# Patient Record
Sex: Female | Born: 1990 | Race: White | Hispanic: No | Marital: Married | Smoking: Current every day smoker
Health system: Southern US, Community
[De-identification: ages and names within clinical notes are randomized; demographics above are authoritative.]

## PROBLEM LIST (undated history)

## (undated) DIAGNOSIS — R87810 Cervical high risk human papillomavirus (HPV) DNA test positive: Secondary | ICD-10-CM

## (undated) DIAGNOSIS — J45909 Unspecified asthma, uncomplicated: Secondary | ICD-10-CM

## (undated) DIAGNOSIS — Z789 Other specified health status: Secondary | ICD-10-CM

## (undated) DIAGNOSIS — R87610 Atypical squamous cells of undetermined significance on cytologic smear of cervix (ASC-US): Secondary | ICD-10-CM

## (undated) HISTORY — DX: Atypical squamous cells of undetermined significance on cytologic smear of cervix (ASC-US): R87.610

## (undated) HISTORY — PX: SHOULDER SURGERY: SHX246

## (undated) HISTORY — DX: Cervical high risk human papillomavirus (HPV) DNA test positive: R87.810

---

## 2009-05-05 ENCOUNTER — Emergency Department: Payer: Self-pay | Admitting: Internal Medicine

## 2010-07-01 ENCOUNTER — Ambulatory Visit: Payer: Self-pay | Admitting: Family Medicine

## 2011-01-17 ENCOUNTER — Ambulatory Visit: Payer: Self-pay | Admitting: Internal Medicine

## 2011-01-19 ENCOUNTER — Ambulatory Visit: Payer: Self-pay | Admitting: Internal Medicine

## 2011-03-19 HISTORY — PX: SHOULDER SURGERY: SHX246

## 2011-04-03 ENCOUNTER — Emergency Department (HOSPITAL_COMMUNITY): Payer: 59

## 2011-04-03 ENCOUNTER — Encounter (HOSPITAL_COMMUNITY): Payer: Self-pay

## 2011-04-03 ENCOUNTER — Encounter (HOSPITAL_COMMUNITY): Payer: Self-pay | Admitting: *Deleted

## 2011-04-03 ENCOUNTER — Observation Stay (HOSPITAL_COMMUNITY)
Admission: EM | Admit: 2011-04-03 | Discharge: 2011-04-04 | Disposition: A | Discharge status: Home / Self Care | Payer: 59 | Attending: Orthopaedic Surgery | Admitting: Orthopaedic Surgery

## 2011-04-03 ENCOUNTER — Encounter (HOSPITAL_COMMUNITY)
Admission: EM | Disposition: A | Discharge status: Home / Self Care | Payer: Self-pay | Source: Home / Self Care | Attending: Emergency Medicine

## 2011-04-03 ENCOUNTER — Encounter: Payer: Self-pay | Admitting: Emergency Medicine

## 2011-04-03 DIAGNOSIS — R51 Headache: Secondary | ICD-10-CM | POA: Insufficient documentation

## 2011-04-03 DIAGNOSIS — S42209A Unspecified fracture of upper end of unspecified humerus, initial encounter for closed fracture: Secondary | ICD-10-CM

## 2011-04-03 DIAGNOSIS — W19XXXA Unspecified fall, initial encounter: Secondary | ICD-10-CM | POA: Insufficient documentation

## 2011-04-03 DIAGNOSIS — Z23 Encounter for immunization: Secondary | ICD-10-CM | POA: Insufficient documentation

## 2011-04-03 DIAGNOSIS — S42213A Unspecified displaced fracture of surgical neck of unspecified humerus, initial encounter for closed fracture: Secondary | ICD-10-CM | POA: Insufficient documentation

## 2011-04-03 DIAGNOSIS — M25519 Pain in unspecified shoulder: Principal | ICD-10-CM | POA: Insufficient documentation

## 2011-04-03 DIAGNOSIS — Y92009 Unspecified place in unspecified non-institutional (private) residence as the place of occurrence of the external cause: Secondary | ICD-10-CM | POA: Insufficient documentation

## 2011-04-03 HISTORY — PX: ORIF HUMERUS FRACTURE: SHX2126

## 2011-04-03 LAB — CBC
HCT: 37.4 % (ref 36.0–46.0)
Hemoglobin: 12.6 g/dL (ref 12.0–15.0)
MCV: 88.6 fL (ref 78.0–100.0)
RBC: 4.22 MIL/uL (ref 3.87–5.11)
RDW: 12.3 % (ref 11.5–15.5)
WBC: 18.9 10*3/uL — ABNORMAL HIGH (ref 4.0–10.5)

## 2011-04-03 LAB — BASIC METABOLIC PANEL
BUN: 8 mg/dL (ref 6–23)
CO2: 23 mEq/L (ref 19–32)
Chloride: 104 mEq/L (ref 96–112)
GFR calc Af Amer: >90 mL/min (ref >90)
Potassium: 3.6 mEq/L (ref 3.5–5.1)

## 2011-04-03 SURGERY — OPEN REDUCTION INTERNAL FIXATION (ORIF) PROXIMAL HUMERUS FRACTURE
Anesthesia: General | Site: Shoulder | Laterality: Right | Wound class: Clean

## 2011-04-03 MED ORDER — MORPHINE SULFATE 2 MG/ML IJ SOLN: 1.0000 mg | INTRAMUSCULAR | Status: DC | PRN

## 2011-04-03 MED ORDER — ONDANSETRON HCL 4 MG/2ML IJ SOLN
INTRAMUSCULAR | Status: DC | PRN
  Administered 2011-04-03: 4 mg via INTRAVENOUS

## 2011-04-03 MED ORDER — METHOCARBAMOL 100 MG/ML IJ SOLN: 500.0000 mg | Freq: Four times a day (QID) | INTRAVENOUS | Status: DC | PRN

## 2011-04-03 MED ORDER — ONDANSETRON HCL 4 MG/2ML IJ SOLN: 4.0000 mg | Freq: Four times a day (QID) | INTRAMUSCULAR | Status: DC | PRN

## 2011-04-03 MED ORDER — DEXAMETHASONE SODIUM PHOSPHATE 10 MG/ML IJ SOLN
INTRAMUSCULAR | Status: DC | PRN
  Administered 2011-04-03: 10 mg via INTRAVENOUS

## 2011-04-03 MED ORDER — HYDROMORPHONE HCL PF 1 MG/ML IJ SOLN
1.0000 mg | Freq: Once | INTRAMUSCULAR | Status: AC
  Administered 2011-04-03: 1 mg via INTRAVENOUS
  Filled 2011-04-03: qty 1

## 2011-04-03 MED ORDER — INFLUENZA VIRUS VACC SPLIT PF IM SUSP
0.5000 mL | INTRAMUSCULAR | Status: DC
  Filled 2011-04-03: qty 0.5

## 2011-04-03 MED ORDER — LIDOCAINE HCL (PF) 1 % IJ SOLN
INTRAMUSCULAR | Status: DC | PRN
  Administered 2011-04-03: 1 mL

## 2011-04-03 MED ORDER — PROPOFOL 10 MG/ML IV EMUL
INTRAVENOUS | Status: DC | PRN
  Administered 2011-04-03: 200 mg via INTRAVENOUS

## 2011-04-03 MED ORDER — TETANUS-DIPHTH-ACELL PERTUSSIS 5-2.5-18.5 LF-MCG/0.5 IM SUSP
0.5000 mL | Freq: Once | INTRAMUSCULAR | Status: AC
  Administered 2011-04-03: 0.5 mL via INTRAMUSCULAR
  Filled 2011-04-03: qty 0.5

## 2011-04-03 MED ORDER — 0.9 % SODIUM CHLORIDE (POUR BTL) OPTIME
TOPICAL | Status: DC | PRN
  Administered 2011-04-03: 1000 mL

## 2011-04-03 MED ORDER — FENTANYL CITRATE 0.05 MG/ML IJ SOLN
INTRAMUSCULAR | Status: DC | PRN
  Administered 2011-04-03 (×2): 50 ug via INTRAVENOUS
  Administered 2011-04-03: 25 ug via INTRAVENOUS

## 2011-04-03 MED ORDER — PHENYLEPHRINE HCL 10 MG/ML IJ SOLN
INTRAMUSCULAR | Status: DC | PRN
  Administered 2011-04-03: 80 ug via INTRAVENOUS

## 2011-04-03 MED ORDER — MORPHINE SULFATE 2 MG/ML IJ SOLN: 0.0500 mg/kg | INTRAMUSCULAR | Status: DC | PRN

## 2011-04-03 MED ORDER — BUPIVACAINE HCL (PF) 0.5 % IJ SOLN
INTRAMUSCULAR | Status: DC | PRN
  Administered 2011-04-03: 25 mL

## 2011-04-03 MED ORDER — METOCLOPRAMIDE HCL 5 MG/ML IJ SOLN
5.0000 mg | Freq: Three times a day (TID) | INTRAMUSCULAR | Status: DC | PRN
  Filled 2011-04-03: qty 2

## 2011-04-03 MED ORDER — METOCLOPRAMIDE HCL 10 MG PO TABS
5.0000 mg | ORAL_TABLET | Freq: Three times a day (TID) | ORAL | Status: DC | PRN
  Administered 2011-04-04: 10 mg via ORAL
  Filled 2011-04-03: qty 1

## 2011-04-03 MED ORDER — NICOTINE 21 MG/24HR TD PT24
21.0000 mg | MEDICATED_PATCH | Freq: Every day | TRANSDERMAL | Status: DC
  Administered 2011-04-03: 21 mg via TRANSDERMAL
  Filled 2011-04-03 (×2): qty 1

## 2011-04-03 MED ORDER — MIDAZOLAM HCL 5 MG/5ML IJ SOLN
INTRAMUSCULAR | Status: DC | PRN
  Administered 2011-04-03: 2 mg via INTRAVENOUS

## 2011-04-03 MED ORDER — SODIUM CHLORIDE 0.9 % IV SOLN
INTRAVENOUS | Status: DC
  Administered 2011-04-03 – 2011-04-04 (×2): via INTRAVENOUS

## 2011-04-03 MED ORDER — GLYCOPYRROLATE 0.2 MG/ML IJ SOLN
INTRAMUSCULAR | Status: DC | PRN
  Administered 2011-04-03: .4 mg via INTRAVENOUS

## 2011-04-03 MED ORDER — ROCURONIUM BROMIDE 100 MG/10ML IV SOLN
INTRAVENOUS | Status: DC | PRN
  Administered 2011-04-03: 30 mg via INTRAVENOUS

## 2011-04-03 MED ORDER — FENTANYL CITRATE 0.05 MG/ML IJ SOLN: 25.0000 ug | INTRAMUSCULAR | Status: DC | PRN

## 2011-04-03 MED ORDER — METOCLOPRAMIDE HCL 5 MG/ML IJ SOLN: 10.0000 mg | Freq: Once | INTRAMUSCULAR | Status: DC | PRN

## 2011-04-03 MED ORDER — SUCCINYLCHOLINE CHLORIDE 20 MG/ML IJ SOLN
INTRAMUSCULAR | Status: DC | PRN
  Administered 2011-04-03: 100 mg via INTRAVENOUS

## 2011-04-03 MED ORDER — OXYCODONE HCL 5 MG PO TABS
5.0000 mg | ORAL_TABLET | ORAL | Status: DC | PRN
  Administered 2011-04-03: 10 mg via ORAL
  Filled 2011-04-03: qty 2

## 2011-04-03 MED ORDER — CEFAZOLIN SODIUM 1-5 GM-% IV SOLN
1.0000 g | Freq: Four times a day (QID) | INTRAVENOUS | Status: AC
  Administered 2011-04-03 – 2011-04-04 (×3): 1 g via INTRAVENOUS
  Filled 2011-04-03 (×3): qty 50

## 2011-04-03 MED ORDER — DIPHENHYDRAMINE HCL 12.5 MG/5ML PO ELIX
12.5000 mg | ORAL_SOLUTION | ORAL | Status: DC | PRN
  Filled 2011-04-03: qty 10

## 2011-04-03 MED ORDER — CEFAZOLIN SODIUM 1-5 GM-% IV SOLN
INTRAVENOUS | Status: DC | PRN
  Administered 2011-04-03: 1 g via INTRAVENOUS

## 2011-04-03 MED ORDER — LACTATED RINGERS IV SOLN
INTRAVENOUS | Status: DC | PRN
  Administered 2011-04-03 (×2): via INTRAVENOUS

## 2011-04-03 MED ORDER — ONDANSETRON HCL 4 MG PO TABS: 4.0000 mg | ORAL_TABLET | Freq: Four times a day (QID) | ORAL | Status: DC | PRN

## 2011-04-03 MED ORDER — HYDROCODONE-ACETAMINOPHEN 5-325 MG PO TABS
1.0000 | ORAL_TABLET | ORAL | Status: DC | PRN
  Administered 2011-04-04: 1 via ORAL
  Filled 2011-04-03: qty 1

## 2011-04-03 MED ORDER — HYDROMORPHONE HCL PF 1 MG/ML IJ SOLN
1.0000 mg | INTRAMUSCULAR | Status: DC | PRN
  Administered 2011-04-03: 1 mg via INTRAVENOUS
  Filled 2011-04-03: qty 1

## 2011-04-03 MED ORDER — NEOSTIGMINE METHYLSULFATE 1 MG/ML IJ SOLN
INTRAMUSCULAR | Status: DC | PRN
  Administered 2011-04-03: 3 mg via INTRAVENOUS

## 2011-04-03 MED ORDER — DROPERIDOL 2.5 MG/ML IJ SOLN
INTRAMUSCULAR | Status: DC | PRN
  Administered 2011-04-03: 0.625 mg via INTRAVENOUS

## 2011-04-03 MED ORDER — METHOCARBAMOL 500 MG PO TABS
500.0000 mg | ORAL_TABLET | Freq: Four times a day (QID) | ORAL | Status: DC | PRN
  Administered 2011-04-03 – 2011-04-04 (×2): 500 mg via ORAL
  Filled 2011-04-03 (×2): qty 1

## 2011-04-03 MED ORDER — ONDANSETRON HCL 4 MG/2ML IJ SOLN
4.0000 mg | Freq: Once | INTRAMUSCULAR | Status: AC
  Administered 2011-04-03: 4 mg via INTRAVENOUS
  Filled 2011-04-03: qty 2

## 2011-04-03 MED ORDER — ZOLPIDEM TARTRATE 5 MG PO TABS: 5.0000 mg | ORAL_TABLET | Freq: Every evening | ORAL | Status: DC | PRN

## 2011-04-03 SURGICAL SUPPLY — 53 items
BIT DRILL 4 LONG FAST STEP (BIT) ×2 IMPLANT
BIT DRILL 4 SHORT FAST STEP (BIT) ×2 IMPLANT
CLOSURE STERI STRIP 1/2 X4 (GAUZE/BANDAGES/DRESSINGS) ×2 IMPLANT
CLOTH BEACON ORANGE TIMEOUT ST (SAFETY) ×2 IMPLANT
DRAPE C-ARM 42X72 X-RAY (DRAPES) ×2 IMPLANT
DRAPE INCISE IOBAN 66X45 STRL (DRAPES) ×4 IMPLANT
DRAPE U-SHAPE 47X51 STRL (DRAPES) ×2 IMPLANT
DRILL BIT 2.8MM ×2 IMPLANT
DRSG PAD ABDOMINAL 8X10 ST (GAUZE/BANDAGES/DRESSINGS) ×4 IMPLANT
ELECT REM PT RETURN 9FT ADLT (ELECTROSURGICAL) ×2
ELECTRODE REM PT RTRN 9FT ADLT (ELECTROSURGICAL) ×1 IMPLANT
GAUZE XEROFORM 1X8 LF (GAUZE/BANDAGES/DRESSINGS) ×2 IMPLANT
GLOVE BIOGEL PI IND STRL 8 (GLOVE) ×2 IMPLANT
GLOVE BIOGEL PI INDICATOR 8 (GLOVE) ×2
GLOVE ORTHO TXT STRL SZ7.5 (GLOVE) ×2 IMPLANT
GOWN PREVENTION PLUS LG XLONG (DISPOSABLE) IMPLANT
GOWN PREVENTION PLUS XLARGE (GOWN DISPOSABLE) ×2 IMPLANT
GOWN STRL NON-REIN LRG LVL3 (GOWN DISPOSABLE) ×2 IMPLANT
KIT BASIN OR (CUSTOM PROCEDURE TRAY) ×2 IMPLANT
KIT ROOM TURNOVER OR (KITS) ×2 IMPLANT
MANIFOLD NEPTUNE II (INSTRUMENTS) ×2 IMPLANT
NEEDLE 22X1 1/2 (OR ONLY) (NEEDLE) IMPLANT
NS IRRIG 1000ML POUR BTL (IV SOLUTION) ×2 IMPLANT
PACK SHOULDER (CUSTOM PROCEDURE TRAY) ×2 IMPLANT
PAD ARMBOARD 7.5X6 YLW CONV (MISCELLANEOUS) ×4 IMPLANT
PEG STND 4.0X25.0MM (Orthopedic Implant) ×2 IMPLANT
PEG STND 4.0X30MM (Orthopedic Implant) ×2 IMPLANT
PEG STND 4.0X32.5MM (Orthopedic Implant) IMPLANT
PEG STND 4.0X37.5MM (Orthopedic Implant) ×4 IMPLANT
PEG STND 4.0X42.5MM (Orthopedic Implant) ×2 IMPLANT
PEGSTD 4.0X30MM (Orthopedic Implant) ×1 IMPLANT
PEGSTD 4.0X32.5MM (Orthopedic Implant) IMPLANT
PEGSTD 4.0X37.5MM (Orthopedic Implant) ×2 IMPLANT
PEGSTD 4.0X42.5MM (Orthopedic Implant) ×1 IMPLANT
PLATE SHOULDER S3 3HOLE RT (Plate) ×2 IMPLANT
SCREW LOCK 90D ANGLED 3.8X26 (Screw) ×2 IMPLANT
SCREW LOCK 90D ANGLED 3.8X28 (Screw) ×2 IMPLANT
SCREW LOCK 90D ANGLED 3.8X30 (Screw) ×2 IMPLANT
SPONGE GAUZE 4X4 12PLY (GAUZE/BANDAGES/DRESSINGS) ×2 IMPLANT
SPONGE LAP 4X18 X RAY DECT (DISPOSABLE) ×4 IMPLANT
STAPLER VISISTAT 35W (STAPLE) ×2 IMPLANT
STRIP CLOSURE SKIN 1/2X4 (GAUZE/BANDAGES/DRESSINGS) ×2 IMPLANT
SUCTION FRAZIER TIP 10 FR DISP (SUCTIONS) ×2 IMPLANT
SUT MNCRL AB 4-0 PS2 18 (SUTURE) ×2 IMPLANT
SUT VIC AB 0 CT1 27 (SUTURE) ×1
SUT VIC AB 0 CT1 27XBRD ANBCTR (SUTURE) ×1 IMPLANT
SUT VIC AB 2-0 CT1 27 (SUTURE) ×2
SUT VIC AB 2-0 CT1 TAPERPNT 27 (SUTURE) ×2 IMPLANT
SYR CONTROL 10ML LL (SYRINGE) IMPLANT
TOWEL OR 17X24 6PK STRL BLUE (TOWEL DISPOSABLE) ×2 IMPLANT
TOWEL OR 17X26 10 PK STRL BLUE (TOWEL DISPOSABLE) ×2 IMPLANT
WATER STERILE IRR 1000ML POUR (IV SOLUTION) IMPLANT
YANKAUER SUCT BULB TIP NO VENT (SUCTIONS) IMPLANT

## 2011-04-03 NOTE — Brief Op Note (Signed)
04/03/2011  11:18 AM  PATIENT:  Kendra Lowe  20 y.o. female  PRE-OPERATIVE DIAGNOSIS:  right proximal humerus fracture  POST-OPERATIVE DIAGNOSIS:  Right Proximal Humerus Fracture  PROCEDURE:  Procedure(s): OPEN REDUCTION INTERNAL FIXATION (ORIF) PROXIMAL HUMERUS FRACTURE  SURGEON:  Surgeon(s): Kathryne Hitch  PHYSICIAN ASSISTANT:   ASSISTANTS: none   ANESTHESIA:   regional and general  EBL:  Total I/O In: 1000 [I.V.:1000] Out: 100 [Blood:100]  BLOOD ADMINISTERED:none  DRAINS: none   LOCAL MEDICATIONS USED:  NONE  SPECIMEN:  No Specimen  DISPOSITION OF SPECIMEN:  N/A  COUNTS:  YES  TOURNIQUET:  * No tourniquets in log *  DICTATION: .Other Dictation: Dictation Number [id number]  PLAN OF CARE: Admit for overnight observation  PATIENT DISPOSITION:  PACU - hemodynamically stable.   Delay start of Pharmacological VTE agent (>24hrs) due to surgical blood loss or risk of bleeding:  {YES/NO/NOT APPLICABLE:20182

## 2011-04-03 NOTE — Anesthesia Procedure Notes (Addendum)
Anesthesia Regional Block:  Interscalene brachial plexus block  Pre-Anesthetic Checklist: ,, timeout performed, Correct Patient, Correct Site, Correct Laterality, Correct Procedure, Correct Position, site marked, Risks and benefits discussed,  Surgical consent,  Pre-op evaluation,  At surgeon's request and post-op pain management  Laterality: Right  Prep: chloraprep       Needles:   Needle Type: Other   (Arrow Echogenic)   Needle Length: 9cm  Needle Gauge: 21    Additional Needles:  Procedures: ultrasound guided Interscalene brachial plexus block Narrative:  Start time: 04/03/2011 8:53 AM End time: 04/03/2011 9:00 AM Injection made incrementally with aspirations every 5 mL.  Performed by: Personally  Anesthesiologist: Aldona Lento, MD  Additional Notes: Ultrasound guidance used to: id relevant anatomy, confirm needle position, local anesthetic spread, avoidance of vascular puncture. Picture saved. No complications. Block performed personally by Janetta Hora. Gelene Mink, MD    Interscalene brachial plexus block Procedure Name: Intubation Date/Time: 04/03/2011 9:25 AM Performed by: Fuller [place] Pre-anesthesia Checklist: Patient identified, Emergency Drugs available, Suction available, Patient being monitored and Timeout performed Patient Re-evaluated:Patient Re-evaluated prior to inductionOxygen Delivery Method: Circle System Utilized Preoxygenation: Pre-oxygenation with 100% oxygen Intubation Type: IV induction and Rapid sequence Laryngoscope Size: Mac and 3 Grade View: Grade I Tube type: Oral Number of attempts: 1 Airway Equipment and Method: stylet Placement Confirmation: ETT inserted through vocal cords under direct vision,  positive ETCO2 and breath sounds checked- equal and bilateral Secured at: 21 cm Tube secured with: Tape Dental Injury: Teeth and Oropharynx as per pre-operative assessment

## 2011-04-03 NOTE — Transfer of Care (Signed)
Immediate Anesthesia Transfer of Care Note  Patient: Oncologist  Procedure(s) Performed:  OPEN REDUCTION INTERNAL FIXATION (ORIF) PROXIMAL HUMERUS FRACTURE  Patient Location: PACU  Anesthesia Type: General  Level of Consciousness: awake, oriented and patient cooperative  Airway & Oxygen Therapy: Patient Spontanous Breathing and Patient connected to nasal cannula oxygen  Post-op Assessment: Report given to PACU RN and Post -op Vital signs reviewed and stable  Post vital signs: Reviewed and stable  Complications: No apparent anesthesia complications

## 2011-04-03 NOTE — ED Notes (Signed)
Pt was trying to go find her dog when a cow knocked her down when she got up the cow hit her in the head and knocked her forward, dried blood to both nares and pt in full spinal, right upper arm pain and deformity,

## 2011-04-03 NOTE — Anesthesia Preprocedure Evaluation (Addendum)
Anesthesia Evaluation  Patient identified by MRN, date of birth, ID band Patient awake    Reviewed: Allergy & Precautions, H&P , NPO status , Patient's Chart, lab work & pertinent test results  History of Anesthesia Complications Negative for: history of anesthetic complications  Airway Mallampati: II TM Distance: >3 FB Neck ROM: Full    Dental  (+) Teeth Intact   Pulmonary neg pulmonary ROS,          Cardiovascular neg cardio ROS Regular Normal    Neuro/Psych Negative Neurological ROS  Negative Psych ROS   GI/Hepatic negative GI ROS, Neg liver ROS,   Endo/Other  Negative Endocrine ROS  Renal/GU negative Renal ROS  Genitourinary negative   Musculoskeletal negative musculoskeletal ROS (+)   Abdominal   Peds negative pediatric ROS (+)  Hematology negative hematology ROS (+)   Anesthesia Other Findings   Reproductive/Obstetrics negative OB ROS                           Anesthesia Physical Anesthesia Plan  ASA: I  Anesthesia Plan: General   Post-op Pain Management: MAC Combined w/ Regional for Post-op pain   Induction: Intravenous  Airway Management Planned: Oral ETT  Additional Equipment:   Intra-op Plan:   Post-operative Plan: Extubation in OR  Informed Consent: I have reviewed the patients History and Physical, chart, labs and discussed the procedure including the risks, benefits and alternatives for the proposed anesthesia with the patient or authorized representative who has indicated his/her understanding and acceptance.   Dental advisory given  Plan Discussed with: CRNA, Surgeon and Anesthesiologist  Anesthesia Plan Comments:        Anesthesia Quick Evaluation

## 2011-04-03 NOTE — Anesthesia Postprocedure Evaluation (Signed)
  Anesthesia Post Note  Patient: Oncologist  Procedure(s) Performed:  OPEN REDUCTION INTERNAL FIXATION (ORIF) PROXIMAL HUMERUS FRACTURE  Anesthesia type: General  Patient location: PACU  Post pain: Pain level controlled  Post assessment: Patient's Cardiovascular Status Stable  Last Vitals:  Filed Vitals:   04/03/11 1148  BP:   Pulse: 66  Temp:   Resp: 15    Post vital signs: Reviewed and stable  Level of consciousness: sedated  Complications: No apparent anesthesia complications

## 2011-04-03 NOTE — ED Notes (Signed)
Pt transported to or

## 2011-04-03 NOTE — H&P (Signed)
Kendra Lowe is an 20 y.o. female.   Chief Complaint:   Right shoulder pain/known proximal humerus fracture HPI:   20 yo right-handed female who was chasing her runaway dog into a field when she encountered a cow. The cow subsequently was startled and trampled her down.  She was brought to the [facility] ER and found to have a right proximal humerus fracture with significant displacement.  She denies other injuries.  She denies numbness in right hand.  History reviewed. No pertinent past medical history.  History reviewed. No pertinent past surgical history.  History reviewed. No pertinent family history. Social History:  does not have a smoking history on file. She does not have any smokeless tobacco history on file. Her alcohol and drug histories not on file.  Allergies: No Known Allergies  Medications Prior to Admission  Medication Dose Route Frequency Provider Last Rate Last Dose  . HYDROmorphone (DILAUDID) injection 1 mg  1 mg Intravenous Once Sunnie Nielsen, MD   1 mg at 04/03/11 0422  . HYDROmorphone (DILAUDID) injection 1 mg  1 mg Intravenous Once Sunnie Nielsen, MD   1 mg at 04/03/11 0542  . HYDROmorphone (DILAUDID) injection 1 mg  1 mg Intravenous Once Sunnie Nielsen, MD   1 mg at 04/03/11 0651  . ondansetron (ZOFRAN) injection 4 mg  4 mg Intravenous Once Sunnie Nielsen, MD   4 mg at 04/03/11 0422  . TDaP (BOOSTRIX) injection 0.5 mL  0.5 mL Intramuscular Once Sunnie Nielsen, MD   0.5 mL at 04/03/11 0420   No current outpatient prescriptions on file as of 04/03/2011.    No results found for this or any previous visit (from the past 48 hour(s)). Dg Chest 2 View  04/03/2011  *RADIOLOGY REPORT*  Clinical Data: Trauma  CHEST - 2 VIEW  Comparison: None.  Findings: Lungs are clear.  Cardiomediastinal contours are within normal limits.  No pleural effusion or pneumothorax.  See shoulder radiograph regarding the humeral neck fracture.  IMPRESSION: No acute cardiopulmonary process.  Right humeral neck  fracture.  Original Report Authenticated By: Waneta Martins, M.D.   Dg Shoulder Right  04/03/2011  *RADIOLOGY REPORT*  Clinical Data: Status post trauma, with right shoulder pain.  RIGHT SHOULDER - 2+ VIEW  Comparison: None.  Findings: There is a displaced mildly comminuted right humeral neck fracture.  The distal component is displaced posteriorly.  The humeral head appears seated within the glenoid.  No additional fracture identified.  IMPRESSION: Right humeral neck fracture.  Original Report Authenticated By: Waneta Martins, M.D.   Ct Head Wo Contrast  04/03/2011  *RADIOLOGY REPORT*  Clinical Data: Status post fall, trauma.  CT HEAD WITHOUT CONTRAST,CT CERVICAL SPINE WITHOUT CONTRAST  Technique:  Contiguous axial images were obtained from the base of the skull through the vertex without contrast.,Technique: Multidetector CT imaging of the cervical spine was performed. Multiplanar CT image reconstructions were also generated.  Comparison: None.  Findings:  Head: There is no evidence for acute hemorrhage, hydrocephalus, mass lesion, or abnormal extra-axial fluid collection.  No definite CT evidence for acute infarction.  The visualized paranasal sinuses and mastoid air cells are predominately clear.  No displaced calvarial fracture.  Cervical spine:  Maintained craniocervical relationship.  No acute fracture or dislocation identified. No prevertebral soft tissue swelling.  Visualized lung apices are clear.  IMPRESSION: No acute intracranial abnormality.  No acute fracture or dislocation of the cervical spine.  Original Report Authenticated By: Waneta Martins, M.D.   Ct  Cervical Spine Wo Contrast  04/03/2011  *RADIOLOGY REPORT*  Clinical Data: Status post fall, trauma.  CT HEAD WITHOUT CONTRAST,CT CERVICAL SPINE WITHOUT CONTRAST  Technique:  Contiguous axial images were obtained from the base of the skull through the vertex without contrast.,Technique: Multidetector CT imaging of the cervical  spine was performed. Multiplanar CT image reconstructions were also generated.  Comparison: None.  Findings:  Head: There is no evidence for acute hemorrhage, hydrocephalus, mass lesion, or abnormal extra-axial fluid collection.  No definite CT evidence for acute infarction.  The visualized paranasal sinuses and mastoid air cells are predominately clear.  No displaced calvarial fracture.  Cervical spine:  Maintained craniocervical relationship.  No acute fracture or dislocation identified. No prevertebral soft tissue swelling.  Visualized lung apices are clear.  IMPRESSION: No acute intracranial abnormality.  No acute fracture or dislocation of the cervical spine.  Original Report Authenticated By: Waneta Martins, M.D.    Review of Systems  All other systems reviewed and are negative.    Blood pressure 126/72, pulse 78, temperature 98.4 F (36.9 C), temperature source Oral, resp. rate 20, last menstrual period 03/21/2011, SpO2 100.00%. Physical Exam  Constitutional: She is oriented to person, place, and time. She appears well-developed and well-nourished.  HENT:  Head: Normocephalic and atraumatic.  Eyes: EOM are normal. Pupils are equal, round, and reactive to light.  Neck: Normal range of motion. Neck supple.  Cardiovascular: Normal rate, regular rhythm and normal heart sounds.   Respiratory: Effort normal and breath sounds normal.  GI: Soft. Bowel sounds are normal.  Musculoskeletal:       Right shoulder: She exhibits decreased range of motion, tenderness, bony tenderness, swelling, effusion and crepitus.  Neurological: She is alert and oriented to person, place, and time.  Skin: Skin is warm and dry.  Psychiatric: She has a normal mood and affect.   Her right arm in neurovascularly intact  Assessment/Plan Right proximal humerus fracture with significant displacement 1) to the OR today for surgical fixation; understands risks and benefits; consent obtained 2) admit to ortho for  observation following surgery.  BLACKMAN,CHRISTOPHER Y 04/03/2011, 7:50 AM

## 2011-04-03 NOTE — ED Provider Notes (Signed)
History     CSN: [id number] Arrival date & time: 04/03/2011  3:20 AM   First MD Initiated Contact with Patient 04/03/11 [phone]      Chief Complaint  Patient presents with  . Fall    (Consider location/radiation/quality/duration/timing/severity/associated sxs/prior treatment) Patient is a 20 y.o. female presenting with trauma. The history is provided by the patient and the EMS personnel.  Trauma This is a new problem. The current episode started less than 1 hour ago. The problem occurs constantly. The problem has not changed since onset.Associated symptoms include headaches. Pertinent negatives include no chest pain, no abdominal pain and no shortness of breath. Exacerbated by: Any palpation of her right shoulder. The symptoms are relieved by nothing. She has tried nothing for the symptoms.   Brought in by EMS in C-spine precautions after trampled by a cow. She was chasing her dog startled cow and knocking her over. She struck her head and did have epistaxis which is resolved prior to arrival. She also injured her right shoulder which is her main complaint at this time. She is able to move it due to pain, but denies any weakness or numbness. No neck pain. No chest pain or trouble breathing. No abdominal pain. No other extremity injuries.Pain is severe, sharp in quality and not radiating. Symptoms constant and persistent since onset. No nausea or vomiting.   History reviewed. No pertinent past medical history.  History reviewed. No pertinent past surgical history.  History reviewed. No pertinent family history.  History  Substance Use Topics  . Smoking status: Not on file  . Smokeless tobacco: Not on file  . Alcohol Use: Not on file    OB History    Grav Para Term Preterm Abortions TAB SAB Ect Mult Living                  Review of Systems  Constitutional: Negative for fever and chills.  HENT: Negative for neck pain and neck stiffness.   Eyes: Negative for pain.  Respiratory:  Negative for shortness of breath.   Cardiovascular: Negative for chest pain.  Gastrointestinal: Negative for abdominal pain.  Genitourinary: Negative for dysuria.  Musculoskeletal: Negative for back pain.  Skin: Negative for rash.  Neurological: Positive for headaches.  All other systems reviewed and are negative.    Allergies  Review of patient's allergies indicates no known allergies.  Home Medications  No current outpatient prescriptions on file.  BP 104/70  Pulse 81  Temp(Src) 98.4 F (36.9 C) (Oral)  Resp 20  SpO2 100%  LMP 03/21/2011  Physical Exam  Constitutional: She is oriented to person, place, and time. She appears well-developed and well-nourished.  HENT:  Head: Normocephalic.       No scalp tenderness or laceration. Dry epistaxis bilateral nares no swelling or nasal deviation and no septal hematoma. No midface instability. No trismus. No dental tenderness.  Eyes: Conjunctivae and EOM are normal. Pupils are equal, round, and reactive to light.  Neck: Full passive range of motion without pain. No tracheal deviation present.       No midline cervical tenderness or deformity. Maintained in C-spine precautions.  Cardiovascular: Normal rate, regular rhythm, S1 normal, S2 normal and intact distal pulses.   Pulmonary/Chest: Effort normal and breath sounds normal. No respiratory distress. She has no wheezes. She has no rales. She exhibits no tenderness.  Abdominal: Soft. Bowel sounds are normal. She exhibits no mass. There is no tenderness. There is no rebound, no guarding and no CVA  tenderness.  Musculoskeletal:       Right upper extremity: Severe tenderness and swelling over the area of right shoulder and proximal humerus. Clavicle is nontender and intact. Elbow is nontender with full range of motion. Wrists and hand are nontender with distal neurovascular intact. Skin is intact throughout. No other extremity injury. Pelvis stable.  Neurological: She is alert and oriented  to person, place, and time. She has normal strength and normal reflexes. No cranial nerve deficit or sensory deficit. She displays a negative Romberg sign. GCS eye subscore is 4. GCS verbal subscore is 5. GCS motor subscore is 6.       Normal Gait  Skin: Skin is warm and dry. No rash noted. No cyanosis. Nails show no clubbing.  Psychiatric: She has a normal mood and affect. Her speech is normal and behavior is normal.    ED Course  Procedures (including critical care time)   Labs Reviewed  CBC  BASIC METABOLIC PANEL   Dg Chest 2 View  04/03/2011  *RADIOLOGY REPORT*  Clinical Data: Trauma  CHEST - 2 VIEW  Comparison: None.  Findings: Lungs are clear.  Cardiomediastinal contours are within normal limits.  No pleural effusion or pneumothorax.  See shoulder radiograph regarding the humeral neck fracture.  IMPRESSION: No acute cardiopulmonary process.  Right humeral neck fracture.  Original Report Authenticated By: Waneta Martins, M.D.   Dg Shoulder Right  04/03/2011  *RADIOLOGY REPORT*  Clinical Data: Status post trauma, with right shoulder pain.  RIGHT SHOULDER - 2+ VIEW  Comparison: None.  Findings: There is a displaced mildly comminuted right humeral neck fracture.  The distal component is displaced posteriorly.  The humeral head appears seated within the glenoid.  No additional fracture identified.  IMPRESSION: Right humeral neck fracture.  Original Report Authenticated By: Waneta Martins, M.D.   Ct Head Wo Contrast  04/03/2011  *RADIOLOGY REPORT*  Clinical Data: Status post fall, trauma.  CT HEAD WITHOUT CONTRAST,CT CERVICAL SPINE WITHOUT CONTRAST  Technique:  Contiguous axial images were obtained from the base of the skull through the vertex without contrast.,Technique: Multidetector CT imaging of the cervical spine was performed. Multiplanar CT image reconstructions were also generated.  Comparison: None.  Findings:  Head: There is no evidence for acute hemorrhage, hydrocephalus,  mass lesion, or abnormal extra-axial fluid collection.  No definite CT evidence for acute infarction.  The visualized paranasal sinuses and mastoid air cells are predominately clear.  No displaced calvarial fracture.  Cervical spine:  Maintained craniocervical relationship.  No acute fracture or dislocation identified. No prevertebral soft tissue swelling.  Visualized lung apices are clear.  IMPRESSION: No acute intracranial abnormality.  No acute fracture or dislocation of the cervical spine.  Original Report Authenticated By: Waneta Martins, M.D.   Ct Cervical Spine Wo Contrast  04/03/2011  *RADIOLOGY REPORT*  Clinical Data: Status post fall, trauma.  CT HEAD WITHOUT CONTRAST,CT CERVICAL SPINE WITHOUT CONTRAST  Technique:  Contiguous axial images were obtained from the base of the skull through the vertex without contrast.,Technique: Multidetector CT imaging of the cervical spine was performed. Multiplanar CT image reconstructions were also generated.  Comparison: None.  Findings:  Head: There is no evidence for acute hemorrhage, hydrocephalus, mass lesion, or abnormal extra-axial fluid collection.  No definite CT evidence for acute infarction.  The visualized paranasal sinuses and mastoid air cells are predominately clear.  No displaced calvarial fracture.  Cervical spine:  Maintained craniocervical relationship.  No acute fracture or dislocation identified. No prevertebral  soft tissue swelling.  Visualized lung apices are clear.  IMPRESSION: No acute intracranial abnormality.  No acute fracture or dislocation of the cervical spine.  Original Report Authenticated By: Waneta Martins, M.D.    Pain control. C-spine precautions. X-rays and CT scans obtained and reviewed as above.  Orthopedic consult. Case discussed as above with Dr. Magnus Ivan who agrees to evaluate patient in the emergency department. Sling provided. IV dilaudid pain control   MDM   Closed fracture right humerus. CT scans  reviewed. C-spine cleared and collar removed.plan OR today.        Sunnie Nielsen, MD 04/03/11 [phone]

## 2011-04-04 MED ORDER — METHOCARBAMOL 500 MG PO TABS: 500.0000 mg | ORAL_TABLET | Freq: Four times a day (QID) | ORAL | Status: AC | PRN

## 2011-04-04 MED ORDER — OXYCODONE-ACETAMINOPHEN 5-325 MG PO TABS: 1.0000 | ORAL_TABLET | ORAL | Status: AC | PRN

## 2011-04-04 NOTE — Discharge Summary (Signed)
Physician Discharge Summary  Patient ID: Kendra Lowe MRN: [id number] DOB/AGE: May 21, 1990 21 y.o.  Admit date: 04/03/2011 Discharge date: 04/04/2011  Admission Diagnoses:  Proximal humerus fracture  Discharge Diagnoses:  Principal Problem:  *Proximal humerus fracture   History reviewed. No pertinent past medical history.  Surgeries: Procedure(s): OPEN REDUCTION INTERNAL FIXATION (ORIF) PROXIMAL HUMERUS FRACTURE on 04/03/2011   Consultants (if any):    Discharged Condition: Improved  Hospital Course: Kendra Lowe is an 20 y.o. female who was admitted 04/03/2011 with a diagnosis of Proximal humerus fracture and went to the operating room on 04/03/2011 and underwent the above named procedures.    She was given perioperative antibiotics:  Anti-infectives     Start     Dose/Rate Route Frequency Ordered Stop   04/03/11 1530   ceFAZolin (ANCEF) IVPB 1 g/50 mL premix        1 g 100 mL/hr over 30 Minutes Intravenous Every 6 hours 04/03/11 1246 04/04/11 0330        .  She was given sequential compression devices, early ambulation, and chemoprophylaxis for DVT prophylaxis.  She benefited maximally from their hospital stay and there were no complications.    Recent vital signs:  Filed Vitals:   04/04/11 0622  BP: 121/60  Pulse: 74  Temp: 98.4 F (36.9 C)  Resp: 20    Recent laboratory studies:  Lab Results  Component Value Date   HGB 12.6 04/03/2011   Lab Results  Component Value Date   WBC 18.9* 04/03/2011   PLT 284 04/03/2011   No results found for this basename: INR   Lab Results  Component Value Date   NA 138 04/03/2011   K 3.6 04/03/2011   CL 104 04/03/2011   CO2 23 04/03/2011   BUN 8 04/03/2011   CREATININE 0.59 04/03/2011   GLUCOSE 133* 04/03/2011    Discharge Medications:   Current Discharge Medication List    START taking these medications   Details  methocarbamol (ROBAXIN) 500 MG tablet Take 1 tablet (500 mg total) by mouth every 6  (six) hours as needed. Qty: 60 tablet, Refills: 1    oxyCODONE-acetaminophen (ROXICET) 5-325 MG per tablet Take 1-2 tablets by mouth every 4 (four) hours as needed for pain. Qty: 60 tablet, Refills: 0        Diagnostic Studies: Dg Chest 2 View  04/03/2011  *RADIOLOGY REPORT*  Clinical Data: Trauma  CHEST - 2 VIEW  Comparison: None.  Findings: Lungs are clear.  Cardiomediastinal contours are within normal limits.  No pleural effusion or pneumothorax.  See shoulder radiograph regarding the humeral neck fracture.  IMPRESSION: No acute cardiopulmonary process.  Right humeral neck fracture.  Original Report Authenticated By: Waneta Martins, M.D.   Dg Shoulder Right  04/03/2011  *RADIOLOGY REPORT*  Clinical Data: Status post trauma, with right shoulder pain.  RIGHT SHOULDER - 2+ VIEW  Comparison: None.  Findings: There is a displaced mildly comminuted right humeral neck fracture.  The distal component is displaced posteriorly.  The humeral head appears seated within the glenoid.  No additional fracture identified.  IMPRESSION: Right humeral neck fracture.  Original Report Authenticated By: Waneta Martins, M.D.   Ct Head Wo Contrast  04/03/2011  *RADIOLOGY REPORT*  Clinical Data: Status post fall, trauma.  CT HEAD WITHOUT CONTRAST,CT CERVICAL SPINE WITHOUT CONTRAST  Technique:  Contiguous axial images were obtained from the base of the skull through the vertex without contrast.,Technique: Multidetector CT imaging of the cervical spine was performed. Multiplanar  CT image reconstructions were also generated.  Comparison: None.  Findings:  Head: There is no evidence for acute hemorrhage, hydrocephalus, mass lesion, or abnormal extra-axial fluid collection.  No definite CT evidence for acute infarction.  The visualized paranasal sinuses and mastoid air cells are predominately clear.  No displaced calvarial fracture.  Cervical spine:  Maintained craniocervical relationship.  No acute fracture or  dislocation identified. No prevertebral soft tissue swelling.  Visualized lung apices are clear.  IMPRESSION: No acute intracranial abnormality.  No acute fracture or dislocation of the cervical spine.  Original Report Authenticated By: Waneta Martins, M.D.   Ct Cervical Spine Wo Contrast  04/03/2011  *RADIOLOGY REPORT*  Clinical Data: Status post fall, trauma.  CT HEAD WITHOUT CONTRAST,CT CERVICAL SPINE WITHOUT CONTRAST  Technique:  Contiguous axial images were obtained from the base of the skull through the vertex without contrast.,Technique: Multidetector CT imaging of the cervical spine was performed. Multiplanar CT image reconstructions were also generated.  Comparison: None.  Findings:  Head: There is no evidence for acute hemorrhage, hydrocephalus, mass lesion, or abnormal extra-axial fluid collection.  No definite CT evidence for acute infarction.  The visualized paranasal sinuses and mastoid air cells are predominately clear.  No displaced calvarial fracture.  Cervical spine:  Maintained craniocervical relationship.  No acute fracture or dislocation identified. No prevertebral soft tissue swelling.  Visualized lung apices are clear.  IMPRESSION: No acute intracranial abnormality.  No acute fracture or dislocation of the cervical spine.  Original Report Authenticated By: Waneta Martins, M.D.   Dg Humerus Right  04/03/2011  *RADIOLOGY REPORT*  Clinical Data: Status post ORIF right humeral head.  RIGHT HUMERUS - 2+ VIEW  Comparison: 04/03/2011  Findings: Fluoroscopy time:  97 seconds  Six images are submitted, showing open reduction internal fixation with a lateral screw plate of the right humerus.  Alignment is near anatomic.  No evidence for new fracture.  IMPRESSION: ORIF right humerus.  Original Report Authenticated By: Patterson Hammersmith, M.D.   Dg C-arm 1-60 Min  04/03/2011  CLINICAL DATA: ORIF   C-ARM 1-60 MINUTES  Fluoroscopy was utilized by the requesting physician.  No  radiographic  interpretation.      Disposition: Discharge to home       Signed: Kathryne Hitch 04/04/2011, 6:58 AM
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
## 2011-04-05 ENCOUNTER — Encounter (HOSPITAL_COMMUNITY): Payer: Self-pay | Admitting: Orthopaedic Surgery

## 2013-11-02 ENCOUNTER — Emergency Department: Payer: Self-pay | Admitting: Emergency Medicine

## 2014-01-22 ENCOUNTER — Emergency Department: Payer: Self-pay | Admitting: Emergency Medicine

## 2014-01-24 LAB — BETA STREP CULTURE(ARMC)

## 2014-03-24 ENCOUNTER — Emergency Department: Payer: Self-pay | Admitting: Emergency Medicine

## 2014-06-30 ENCOUNTER — Emergency Department: Payer: Self-pay | Admitting: Emergency Medicine

## 2015-03-05 LAB — HM HIV SCREENING LAB: HM HIV Screening: NEGATIVE

## 2015-12-09 DIAGNOSIS — R87810 Cervical high risk human papillomavirus (HPV) DNA test positive: Secondary | ICD-10-CM

## 2015-12-09 DIAGNOSIS — R87610 Atypical squamous cells of undetermined significance on cytologic smear of cervix (ASC-US): Secondary | ICD-10-CM

## 2015-12-09 HISTORY — DX: Atypical squamous cells of undetermined significance on cytologic smear of cervix (ASC-US): R87.610

## 2015-12-09 HISTORY — DX: Cervical high risk human papillomavirus (HPV) DNA test positive: R87.810

## 2015-12-09 LAB — HM PAP SMEAR
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
## 2016-03-21 LAB — OB RESULTS CONSOLE HIV ANTIBODY (ROUTINE TESTING): HIV: NONREACTIVE

## 2016-03-21 LAB — OB RESULTS CONSOLE RUBELLA ANTIBODY, IGM: Rubella: IMMUNE

## 2016-03-21 LAB — OB RESULTS CONSOLE HEPATITIS B SURFACE ANTIGEN: Hepatitis B Surface Ag: NEGATIVE

## 2016-03-21 LAB — OB RESULTS CONSOLE VARICELLA ZOSTER ANTIBODY, IGG: Varicella: IMMUNE

## 2016-03-21 LAB — OB RESULTS CONSOLE RPR: RPR: NONREACTIVE

## 2016-04-05 ENCOUNTER — Other Ambulatory Visit: Payer: Self-pay | Admitting: Certified Nurse Midwife

## 2016-04-05 DIAGNOSIS — Z3689 Encounter for other specified antenatal screening: Secondary | ICD-10-CM

## 2016-04-28 ENCOUNTER — Encounter: Payer: Self-pay | Admitting: *Deleted

## 2016-04-28 ENCOUNTER — Ambulatory Visit
Admission: RE | Admit: 2016-04-28 | Discharge: 2016-04-28 | Disposition: A | Discharge status: Home / Self Care | Payer: Medicaid Other | Source: Ambulatory Visit | Attending: Maternal and Fetal Medicine | Admitting: Maternal and Fetal Medicine

## 2016-04-28 DIAGNOSIS — Z3A32 32 weeks gestation of pregnancy: Secondary | ICD-10-CM | POA: Diagnosis not present

## 2016-04-28 DIAGNOSIS — Z3689 Encounter for other specified antenatal screening: Secondary | ICD-10-CM

## 2016-04-28 HISTORY — DX: Other specified health status: Z78.9

## 2016-05-02 ENCOUNTER — Encounter: Payer: Medicaid Other | Attending: Obstetrics and Gynecology | Admitting: Dietician

## 2016-05-02 ENCOUNTER — Encounter: Payer: Self-pay | Admitting: Dietician

## 2016-05-02 VITALS — BP 102/60 | Ht 63.0 in | Wt 183.3 lb

## 2016-05-02 DIAGNOSIS — O24410 Gestational diabetes mellitus in pregnancy, diet controlled: Secondary | ICD-10-CM

## 2016-05-02 DIAGNOSIS — O24419 Gestational diabetes mellitus in pregnancy, unspecified control: Secondary | ICD-10-CM | POA: Insufficient documentation

## 2016-05-02 DIAGNOSIS — Z713 Dietary counseling and surveillance: Secondary | ICD-10-CM | POA: Diagnosis present

## 2016-05-02 NOTE — Patient Instructions (Signed)
Read booklet on Gestational Diabetes Follow Gestational Meal Planning Guidelines Complete a 3 Day Food Record and bring to next appointment Check blood sugars 4 x day - before breakfast and 2 hrs after every meal and record  Bring blood sugar log to all appointments Call MD for prescription for meter strips and lancets Strips: Accuchek Guide test strips  Lancets: Accuchek Fast Clix lancets Walk 20-30 minutes at least 5 x week if permitted by MD Next appointment   05-09-16

## 2016-05-02 NOTE — Progress Notes (Signed)
Diabetes Self-Management Education  Visit Type: First/Initial  Appt. Start Time:1330 Appt. End Time: 1500  05/02/2016  Kendra Lowe, identified by name and date of birth, is a 26 y.o. female with a diagnosis of Diabetes: Gestational Diabetes.   ASSESSMENT  Blood pressure 102/60, height 5\' 3"  (1.6 m), weight 183 lb 4.8 oz (83.1 kg). Body mass index is 32.47 kg/m. Lacks knowledge of diabetes care Does not have a BG meter     Diabetes Self-Management Education - 05/02/16 1630      Visit Information   Visit Type First/Initial     Initial Visit   Diabetes Type Gestational Diabetes     Health Coping   How would you rate your overall health? Good     Psychosocial Assessment   Patient Belief/Attitude about Diabetes Motivated to manage diabetes   Self-care barriers None   Patient Concerns Glycemic Control;Healthy Lifestyle  become more fit   Special Needs None   Preferred Learning Style Hands on;Visual;Auditory   Learning Readiness Ready   What is the last grade level you completed in school? 12+     Pre-Education Assessment   Patient understands the diabetes disease and treatment process. Needs Instruction   Patient understands incorporating nutritional management into lifestyle. Needs Instruction   Patient undertands incorporating physical activity into lifestyle. Needs Instruction   Patient understands using medications safely. Needs Instruction   Patient understands monitoring blood glucose, interpreting and using results Needs Instruction   Patient understands prevention, detection, and treatment of acute complications. Needs Instruction   Patient understands prevention, detection, and treatment of chronic complications. Needs Instruction   Patient understands how to develop strategies to address psychosocial issues. Needs Instruction   Patient understands how to develop strategies to promote health/change behavior. Needs Instruction     Complications   How often  do you check your blood sugar? 0 times/day (not testing)   Have you had a dilated eye exam in the past 12 months? No  5 years ago   Have you had a dental exam in the past 12 months? Yes  about 1 year ago   Are you checking your feet? No     Dietary Intake   Breakfast --  eats breakfast at 10-11a   Snack (morning) --  none   Lunch --  eats lunch at 2-3p=eats fried foods 4-5x/wk and sweets 2-3x/wk.   Snack (afternoon) --  eats chips at 5p daily   Dinner --  eats supper at [employer] (evening) --  none   Beverage(s) --  drinks fruit juice 2x/day and regular soda and sweet tea 6-7x/day; drinks only 2-3 glasses of water/day     Exercise   Exercise Type ADL's     Patient Education   Previous Diabetes Education No   Disease state  Factors that contribute to the development of diabetes;Explored patient's options for treatment of their diabetes;Definition of diabetes, type 1 and 2, and the diagnosis of diabetes   Nutrition management  Role of diet in the treatment of diabetes and the relationship between the three main macronutrients and blood glucose level;Food label reading, portion sizes and measuring food.;Carbohydrate counting   Physical activity and exercise  Role of exercise on diabetes management, blood pressure control and cardiac health.   Medications --  discussed medications used for treatment of GDM   Monitoring Taught/evaluated SMBG meter.;Purpose and frequency of SMBG.;Taught/discussed recording of test results and interpretation of SMBG.  gave pt Accuchek Guide meter and instructed on  its use-FBG 95   Acute complications --  reviewed symptoms of high and low BG's   Psychosocial adjustment Role of stress on diabetes   Preconception care Pregnancy and GDM  Role of pre-pregnancy blood glucose control on the development of the fetus;Reviewed with patient blood glucose goals with pregnancy;Role of family planning for patients with diabetes   Personal strategies to promote  health Lifestyle issues that need to be addressed for better diabetes care;Helped patient develop diabetes management plan for (enter comment)      Individualized Plan for Diabetes Self-Management Training:   Learning Objective:  Patient will have a greater understanding of diabetes self-management. Patient education plan is to attend individual and/or group sessions per assessed needs and concerns.   Plan:   Patient Instructions  Read booklet on Gestational Diabetes Follow Gestational Meal Planning Guidelines Complete a 3 Day Food Record and bring to next appointment Check blood sugars 4 x day - before breakfast and 2 hrs after every meal and record  Bring blood sugar log to all appointments Call MD for prescription for meter strips and lancets Strips: Accuchek Guide test strips  Lancets: Accuchek Fast Clix lancets Walk 20-30 minutes at least 5 x week if permitted by MD Next appointment   05-09-16   Expected Outcomes:    positive Education material provided: General meal planning guidelines, Accuchek Guide meter, GDM booklet and GDM video  If problems or questions, patient to contact team via: [phone]  Future DSME appointment:  05-09-16

## 2016-05-09 ENCOUNTER — Ambulatory Visit: Payer: Medicaid Other | Admitting: Dietician

## 2016-05-11 ENCOUNTER — Telehealth: Payer: Self-pay | Admitting: Dietician

## 2016-05-11 NOTE — Telephone Encounter (Signed)
Called patient to reschedule appointment which she missed on 05/09/16. Rescheduled for 1//31/18.

## 2016-05-18 ENCOUNTER — Encounter: Payer: Self-pay | Admitting: Dietician

## 2016-05-18 ENCOUNTER — Encounter: Payer: Medicaid Other | Admitting: Dietician

## 2016-05-18 VITALS — BP 110/66 | Ht 63.0 in | Wt 183.3 lb

## 2016-05-18 DIAGNOSIS — Z713 Dietary counseling and surveillance: Secondary | ICD-10-CM | POA: Diagnosis not present

## 2016-05-18 DIAGNOSIS — O24410 Gestational diabetes mellitus in pregnancy, diet controlled: Secondary | ICD-10-CM

## 2016-05-18 NOTE — Progress Notes (Signed)
   Patient's BG record indicates BGs are within goal ranges; she was out of supplies for over 1 week but now has new supply of strips and will resume testing.   Patient's food diary indicates balanced meals; some meals low in carbohydrate. Instructed patient on appropriate carbohydrate intake and choices.    Provided 1700kcal meal plan, and wrote individualized menus based on patient's food preferences.  Instructed patient on food safety, including avoidance of Listeriosis, and limiting mercury from fish.  Discussed importance of maintaining healthy lifestyle habits to reduce risk of Type 2 DM as well as Gestational DM with any future pregnancies.  Advised patient to use any remaining testing supplies to test some BGs after delivery, and to have BG tested ideally annually, as well as prior to attempting future pregnancies.

## 2016-05-18 NOTE — Patient Instructions (Signed)
   Make sure to have something to eat every 3-5 hours during the day. Avoid going longer than 12 hours overnight without something to eat.   Include at least 30g or 2 servings of carbohydrate foods with each meal. Healthy choices include whole grain foods, beans, fruits, and milk or yogurt.

## 2016-05-26 LAB — OB RESULTS CONSOLE GC/CHLAMYDIA
CHLAMYDIA, DNA PROBE: NEGATIVE
GC PROBE AMP, GENITAL: NEGATIVE

## 2016-05-26 LAB — OB RESULTS CONSOLE GBS: GBS: NEGATIVE

## 2016-06-16 ENCOUNTER — Encounter: Payer: Self-pay | Admitting: Obstetrics and Gynecology

## 2016-06-17 ENCOUNTER — Inpatient Hospital Stay
Admission: EM | Admit: 2016-06-17 | Discharge: 2016-06-22 | DRG: 765 | Disposition: A | Discharge status: Home / Self Care | Payer: Medicaid Other | Attending: Obstetrics and Gynecology | Admitting: Obstetrics and Gynecology

## 2016-06-17 DIAGNOSIS — O24410 Gestational diabetes mellitus in pregnancy, diet controlled: Secondary | ICD-10-CM | POA: Diagnosis present

## 2016-06-17 DIAGNOSIS — O24420 Gestational diabetes mellitus in childbirth, diet controlled: Principal | ICD-10-CM | POA: Diagnosis present

## 2016-06-17 DIAGNOSIS — O9081 Anemia of the puerperium: Secondary | ICD-10-CM | POA: Diagnosis not present

## 2016-06-17 DIAGNOSIS — Z3A39 39 weeks gestation of pregnancy: Secondary | ICD-10-CM | POA: Diagnosis not present

## 2016-06-17 DIAGNOSIS — D62 Acute posthemorrhagic anemia: Secondary | ICD-10-CM | POA: Diagnosis not present

## 2016-06-17 DIAGNOSIS — Z98891 History of uterine scar from previous surgery: Secondary | ICD-10-CM

## 2016-06-17 LAB — CBC
HEMATOCRIT: 32 % — AB (ref 35.0–47.0)
HEMOGLOBIN: 10.4 g/dL — AB (ref 12.0–16.0)
MCH: 26.9 pg (ref 26.0–34.0)
MCHC: 32.5 g/dL (ref 32.0–36.0)
MCV: 82.7 fL (ref 80.0–100.0)
Platelets: 322 10*3/uL (ref 150–440)
RBC: 3.87 MIL/uL (ref 3.80–5.20)
RDW: 13.3 % (ref 11.5–14.5)
WBC: 17 10*3/uL — AB (ref 3.6–11.0)

## 2016-06-17 LAB — TYPE AND SCREEN
ABO/RH(D): A POS
Antibody Screen: NEGATIVE

## 2016-06-17 LAB — GLUCOSE, CAPILLARY: Glucose-Capillary: 144 mg/dL — ABNORMAL HIGH (ref 65–99)

## 2016-06-17 MED ORDER — LACTATED RINGERS IV SOLN
INTRAVENOUS | Status: DC
  Administered 2016-06-17 – 2016-06-18 (×3): via INTRAVENOUS
  Administered 2016-06-18: 125 mL/h via INTRAVENOUS
  Administered 2016-06-19 – 2016-06-20 (×5): via INTRAVENOUS

## 2016-06-17 MED ORDER — SOD CITRATE-CITRIC ACID 500-334 MG/5ML PO SOLN
30.0000 mL | ORAL | Status: DC | PRN
  Administered 2016-06-20: 30 mL via ORAL
  Filled 2016-06-17: qty 15

## 2016-06-17 MED ORDER — OXYTOCIN BOLUS FROM INFUSION: 500.0000 mL | Freq: Once | INTRAVENOUS | Status: DC

## 2016-06-17 MED ORDER — TERBUTALINE SULFATE 1 MG/ML IJ SOLN: 0.2500 mg | Freq: Once | INTRAMUSCULAR | Status: DC | PRN

## 2016-06-17 MED ORDER — OXYTOCIN 10 UNIT/ML IJ SOLN: 10.0000 [IU] | Freq: Once | INTRAMUSCULAR | Status: DC

## 2016-06-17 MED ORDER — LACTATED RINGERS IV SOLN
500.0000 mL | INTRAVENOUS | Status: DC | PRN
  Administered 2016-06-19 – 2016-06-20 (×3): 500 mL via INTRAVENOUS

## 2016-06-17 MED ORDER — OXYTOCIN 40 UNITS IN LACTATED RINGERS INFUSION - SIMPLE MED
2.5000 [IU]/h | INTRAVENOUS | Status: DC
  Administered 2016-06-20: 400 mL via INTRAVENOUS
  Filled 2016-06-17: qty 1000

## 2016-06-17 MED ORDER — ONDANSETRON HCL 4 MG/2ML IJ SOLN: 4.0000 mg | Freq: Four times a day (QID) | INTRAMUSCULAR | Status: DC | PRN

## 2016-06-17 MED ORDER — DINOPROSTONE 10 MG VA INST
10.0000 mg | VAGINAL_INSERT | Freq: Once | VAGINAL | Status: AC
Start: 2016-06-17 — End: 2016-06-17
  Administered 2016-06-17: 10 mg via VAGINAL
  Filled 2016-06-17: qty 1

## 2016-06-17 MED ORDER — LIDOCAINE HCL (PF) 1 % IJ SOLN: 30.0000 mL | INTRAMUSCULAR | Status: DC | PRN

## 2016-06-17 NOTE — H&P (Signed)
History and Physical Interval Note:  Kendra Lowe is a 26 y.o. G1P0 female at 920w6d who presents for IOL for GDM.  See paper H&P in chart.   No changes noted.   Plan reviewed.  Plan for IOL by cervical ripening with cervidil.   Thomasene MohairStephen Jackson, MD 06/17/2016 9:00 PM

## 2016-06-18 ENCOUNTER — Encounter: Payer: Self-pay | Admitting: *Deleted

## 2016-06-18 LAB — GLUCOSE, CAPILLARY
Glucose-Capillary: 103 mg/dL — ABNORMAL HIGH (ref 65–99)
Glucose-Capillary: 108 mg/dL — ABNORMAL HIGH (ref 65–99)
Glucose-Capillary: 113 mg/dL — ABNORMAL HIGH (ref 65–99)
Glucose-Capillary: 118 mg/dL — ABNORMAL HIGH (ref 65–99)
Glucose-Capillary: 140 mg/dL — ABNORMAL HIGH (ref 65–99)

## 2016-06-18 MED ORDER — ZOLPIDEM TARTRATE 5 MG PO TABS
5.0000 mg | ORAL_TABLET | Freq: Every evening | ORAL | Status: DC | PRN
  Administered 2016-06-18: 5 mg via ORAL

## 2016-06-18 MED ORDER — MISOPROSTOL 25 MCG QUARTER TABLET
25.0000 ug | ORAL_TABLET | ORAL | Status: DC
  Administered 2016-06-18: 25 ug via VAGINAL
  Filled 2016-06-18: qty 1

## 2016-06-18 MED ORDER — OXYTOCIN 40 UNITS IN LACTATED RINGERS INFUSION - SIMPLE MED
1.0000 m[IU]/min | INTRAVENOUS | Status: DC
  Administered 2016-06-18: 1 m[IU]/min via INTRAVENOUS
  Administered 2016-06-19: 9 m[IU]/min via INTRAVENOUS
  Administered 2016-06-19: 7 m[IU]/min via INTRAVENOUS
  Administered 2016-06-19: 1 m[IU]/min via INTRAVENOUS
  Filled 2016-06-18: qty 1000

## 2016-06-18 MED ORDER — TERBUTALINE SULFATE 1 MG/ML IJ SOLN: 0.2500 mg | Freq: Once | INTRAMUSCULAR | Status: DC | PRN

## 2016-06-18 MED ORDER — ZOLPIDEM TARTRATE 5 MG PO TABS
ORAL_TABLET | ORAL | Status: AC
  Administered 2016-06-18: 5 mg via ORAL
  Filled 2016-06-18: qty 1

## 2016-06-18 MED ORDER — MISOPROSTOL 200 MCG PO TABS
ORAL_TABLET | ORAL | Status: AC
  Administered 2016-06-18: 25 ug via VAGINAL
  Filled 2016-06-18: qty 4

## 2016-06-18 MED ORDER — DINOPROSTONE 10 MG VA INST
10.0000 mg | VAGINAL_INSERT | Freq: Once | VAGINAL | Status: DC
  Filled 2016-06-18: qty 1

## 2016-06-18 NOTE — Progress Notes (Signed)
Mild pain w ctxs noted after one dose of Cytotec Freq of ctxs 1-2 min FHT reassuring (reactive) Cervix unchanged BS WNL Will change to Pitocin

## 2016-06-18 NOTE — Progress Notes (Signed)
L&D Progress NOte  26 year old G1 P0 with GDMA1 admitted last night at 39wk6d for IOL. She received Cervidil ripening last night-it was inserted around 2118.  S: Nurses reported patient slept off and on during the night. [name] states contractions started bothering her around 4AM. Most contractions she rates as a 5/10, occasionally they may be a 8/10.  O: BP 112/74   Pulse 86   Temp 97.8 F (36.6 C) (Oral)   Resp 16   Ht 5\' 3"  (1.6 m)   Wt 87.1 kg (192 lb)   BMI 34.01 kg/m   CBG 140s last night when eating, and 113 during the early AM after sleeping for a few hours.  FHR; 135-140 with accelerations to 150s to 160s, moderate variability Toco: q1-4 min apart contractions, with some runs of contractions.  Abdomen: vtx (ROT)/ EFW 8#  Cervidil removed Cervix: closed/ posterior/50%/med  A: IOL for GDMA1 (unsure as to how well controlled -never brought in log)-Bishop score4 after cervidil FWB: Cat1  P: Will shower and eat breakfast If contractions still close together, will start Pitocin and if they space out, will start Cytotec  Farrel Connersolleen Holdan Stucke, CNM

## 2016-06-19 ENCOUNTER — Inpatient Hospital Stay: Payer: Medicaid Other | Admitting: Anesthesiology

## 2016-06-19 LAB — CBC
HCT: 31.5 % — ABNORMAL LOW (ref 35.0–47.0)
Hemoglobin: 10.1 g/dL — ABNORMAL LOW (ref 12.0–16.0)
MCH: 26.8 pg (ref 26.0–34.0)
MCHC: 32.2 g/dL (ref 32.0–36.0)
MCV: 83.1 fL (ref 80.0–100.0)
PLATELETS: 310 10*3/uL (ref 150–440)
RBC: 3.79 MIL/uL — AB (ref 3.80–5.20)
RDW: 13.5 % (ref 11.5–14.5)
WBC: 16.2 10*3/uL — AB (ref 3.6–11.0)

## 2016-06-19 LAB — RPR: RPR: NONREACTIVE

## 2016-06-19 LAB — GLUCOSE, CAPILLARY
GLUCOSE-CAPILLARY: 112 mg/dL — AB (ref 65–99)
GLUCOSE-CAPILLARY: 127 mg/dL — AB (ref 65–99)
GLUCOSE-CAPILLARY: 82 mg/dL (ref 65–99)

## 2016-06-19 MED ORDER — DIPHENHYDRAMINE HCL 25 MG PO CAPS: 25.0000 mg | ORAL_CAPSULE | ORAL | Status: DC | PRN

## 2016-06-19 MED ORDER — FENTANYL 2.5 MCG/ML W/ROPIVACAINE 0.2% IN NS 100 ML EPIDURAL INFUSION (ARMC-ANES)
10.0000 mL/h | EPIDURAL | Status: DC
  Administered 2016-06-19: 10 mL/h via EPIDURAL
  Filled 2016-06-19 (×2): qty 100

## 2016-06-19 MED ORDER — SODIUM CHLORIDE FLUSH 0.9 % IV SOLN
INTRAVENOUS | Status: AC
  Filled 2016-06-19: qty 10

## 2016-06-19 MED ORDER — DEXTROSE 5 % IV SOLN
1.0000 ug/kg/h | INTRAVENOUS | Status: DC | PRN
  Filled 2016-06-19: qty 2

## 2016-06-19 MED ORDER — NALBUPHINE HCL 10 MG/ML IJ SOLN: 5.0000 mg | INTRAMUSCULAR | Status: DC | PRN

## 2016-06-19 MED ORDER — DIPHENHYDRAMINE HCL 50 MG/ML IJ SOLN: 12.5000 mg | INTRAMUSCULAR | Status: DC | PRN

## 2016-06-19 MED ORDER — FENTANYL 2.5 MCG/ML W/ROPIVACAINE 0.2% IN NS 100 ML EPIDURAL INFUSION (ARMC-ANES)
EPIDURAL | Status: AC
  Filled 2016-06-19: qty 100

## 2016-06-19 MED ORDER — FENTANYL 2.5 MCG/ML W/ROPIVACAINE 0.2% IN NS 100 ML EPIDURAL INFUSION (ARMC-ANES)
EPIDURAL | Status: DC | PRN
  Administered 2016-06-19: 10 mL/h via EPIDURAL

## 2016-06-19 MED ORDER — SODIUM CHLORIDE 0.9 % IV SOLN
INTRAVENOUS | Status: DC | PRN
  Administered 2016-06-19 (×3): 5 mL via EPIDURAL

## 2016-06-19 MED ORDER — NALOXONE HCL 0.4 MG/ML IJ SOLN: 0.4000 mg | INTRAMUSCULAR | Status: DC | PRN

## 2016-06-19 MED ORDER — NALBUPHINE HCL 10 MG/ML IJ SOLN: 5.0000 mg | Freq: Once | INTRAMUSCULAR | Status: DC | PRN

## 2016-06-19 MED ORDER — BUTORPHANOL TARTRATE 1 MG/ML IJ SOLN
1.0000 mg | INTRAMUSCULAR | Status: DC | PRN
  Administered 2016-06-19 (×2): 1 mg via INTRAVENOUS
  Filled 2016-06-19 (×2): qty 1

## 2016-06-19 MED ORDER — ONDANSETRON HCL 4 MG/2ML IJ SOLN
4.0000 mg | Freq: Three times a day (TID) | INTRAMUSCULAR | Status: DC | PRN
  Administered 2016-06-19 – 2016-06-20 (×2): 4 mg via INTRAVENOUS
  Filled 2016-06-19: qty 2

## 2016-06-19 MED ORDER — LIDOCAINE-EPINEPHRINE (PF) 1.5 %-1:200000 IJ SOLN
INTRAMUSCULAR | Status: DC | PRN
  Administered 2016-06-19: 3 mL

## 2016-06-19 MED ORDER — SODIUM CHLORIDE 0.9% FLUSH: 3.0000 mL | INTRAVENOUS | Status: DC | PRN

## 2016-06-19 NOTE — Progress Notes (Signed)
Pt describes severe pain w ctxs Freq of ctxs 1-2 min on Pitocin 5 mU/min FHT reassuring (reactive) Cervix 1.5 cm, SROM 0330 clear BS 127 this am Will cont Pitocin; Stadol; epidural

## 2016-06-19 NOTE — Progress Notes (Signed)
  Labor Progress Note   26 y.o. G1P0 @ 5166w1d , admitted for  Pregnancy, Labor Management. IOL - GDMA1  Subjective:  Comfortable w epidural  Objective:  BP (!) 103/48   Pulse 83   Temp 98.8 F (37.1 C) (Oral)   Resp 14   Ht 5\' 3"  (1.6 m)   Wt 192 lb (87.1 kg)   SpO2 98%   BMI 34.01 kg/m  Abd: mild Extr: trace to 1+ bilateral pedal edema SVE: CERVIX: 5 cm dilated, 80 effaced, -2 station  EFM: FHR: 150  bpm, variability: moderate,  Accelerations: present   ,  decelerations:  Absent (one episode of three var decels of min drop in FHR w return to baseline; positioning and O2 applied, Pitocin held) Toco: Frequency: Every 3-5 minutes Labs: I have reviewed the patient's lab results.   Assessment & Plan:  G1P0 @ 6566w1d, admitted for  Pregnancy and Labor/Delivery Management  1. Pain management: epidural. 2. FWB: FHT category 1.  3. ID: GBS negative 4. Labor management: Restart Pitocin and counseled on FITL as a reason for CS if decels become repetitive   All discussed with patient, see orders

## 2016-06-19 NOTE — Anesthesia Procedure Notes (Signed)
Epidural Patient location during procedure: OB Start time: 06/19/2016 11:10 AM End time: 06/19/2016 11:34 AM  Staffing Anesthesiologist: Alver FisherPENWARDEN, Jalani Rominger Performed: anesthesiologist   Preanesthetic Checklist Completed: patient identified, site marked, surgical consent, pre-op evaluation, timeout performed, IV checked, risks and benefits discussed and monitors and equipment checked  Epidural Patient position: sitting Prep: ChloraPrep Patient monitoring: heart rate, continuous pulse ox and blood pressure Approach: midline Location: L3-L4 Injection technique: LOR saline  Needle:  Needle type: Tuohy  Needle gauge: 18 G Needle length: 9 cm and 9 Needle insertion depth: 6 cm Catheter type: closed end flexible Catheter size: 20 Guage Catheter at skin depth: 10 cm Test dose: negative (0.125% bupivacaine)  Assessment Events: blood not aspirated, injection not painful, no injection resistance, negative IV test and no paresthesia  Additional Notes   Patient tolerated the insertion well without complications.Reason for block:procedure for pain

## 2016-06-19 NOTE — Anesthesia Preprocedure Evaluation (Addendum)
Anesthesia Evaluation  Patient identified by MRN, date of birth, ID band Patient awake    Reviewed: Allergy & Precautions, NPO status , Patient's Chart, lab work & pertinent test results  History of Anesthesia Complications Negative for: history of anesthetic complications  Airway Mallampati: II  TM Distance: >3 FB Neck ROM: Full    Dental no notable dental hx.    Pulmonary neg sleep apnea, neg COPD, former smoker,    breath sounds clear to auscultation- rhonchi (-) wheezing      Cardiovascular Exercise Tolerance: Good (-) hypertension(-) CAD and (-) Past MI  Rhythm:Regular Rate:Normal - Systolic murmurs and - Diastolic murmurs    Neuro/Psych negative neurological ROS  negative psych ROS   GI/Hepatic negative GI ROS, Neg liver ROS,   Endo/Other  diabetes (diet controlled gDM)  Renal/GU negative Renal ROS     Musculoskeletal negative musculoskeletal ROS (+)   Abdominal (+) + obese, Gravid abdomen   Peds  Hematology negative hematology ROS (+)   Anesthesia Other Findings   Reproductive/Obstetrics (+) Pregnancy                            Anesthesia Physical Anesthesia Plan  ASA: III  Anesthesia Plan: Epidural   Post-op Pain Management:    Induction:   Airway Management Planned:   Additional Equipment:   Intra-op Plan:   Post-operative Plan:   Informed Consent: I have reviewed the patients History and Physical, chart, labs and discussed the procedure including the risks, benefits and alternatives for the proposed anesthesia with the patient or authorized representative who has indicated his/her understanding and acceptance.     Plan Discussed with: Anesthesiologist  Anesthesia Plan Comments: (Plan for epidural for labor, discussed epidural vs spinal vs GA if need for csection)       Lab Results  Component Value Date   WBC 16.2 (H) 06/19/2016   HGB 10.1 (L) 06/19/2016    HCT 31.5 (L) 06/19/2016   MCV 83.1 06/19/2016   PLT 310 06/19/2016    Anesthesia Quick Evaluation

## 2016-06-19 NOTE — Progress Notes (Signed)
Pt describes improved pain after epidural Freq of ctxs 1-2 min on Pitocin 9 mU/min FHT reassuring (reactive), although a period of decels right after epidural, improved w positioning and O2 and pausing Pitocin Cervix 4 cm, 80%, -2 BS 112 Will restart Pitocin CS risks discussed based on fetal response to labor

## 2016-06-20 ENCOUNTER — Encounter: Payer: Self-pay | Admitting: Anesthesiology

## 2016-06-20 ENCOUNTER — Encounter
Admission: EM | Disposition: A | Discharge status: Home / Self Care | Payer: Self-pay | Source: Home / Self Care | Attending: Obstetrics and Gynecology

## 2016-06-20 LAB — GLUCOSE, CAPILLARY
GLUCOSE-CAPILLARY: 105 mg/dL — AB (ref 65–99)
Glucose-Capillary: 95 mg/dL (ref 65–99)

## 2016-06-20 SURGERY — Surgical Case
Anesthesia: Epidural | Site: Abdomen | Wound class: Clean Contaminated

## 2016-06-20 MED ORDER — LACTATED RINGERS IV SOLN
INTRAVENOUS | Status: DC
  Administered 2016-06-20 – 2016-06-21 (×2): via INTRAVENOUS

## 2016-06-20 MED ORDER — OXYCODONE-ACETAMINOPHEN 5-325 MG PO TABS
2.0000 | ORAL_TABLET | ORAL | Status: DC | PRN
  Administered 2016-06-20: 2 via ORAL
  Filled 2016-06-20: qty 2

## 2016-06-20 MED ORDER — LIDOCAINE HCL (PF) 2 % IJ SOLN
INTRAMUSCULAR | Status: DC | PRN
  Administered 2016-06-20: 5 mL via INTRADERMAL
  Administered 2016-06-20: 3 mL via INTRADERMAL
  Administered 2016-06-20 (×2): 5 mL via INTRADERMAL
  Administered 2016-06-20: 10 mL via INTRADERMAL

## 2016-06-20 MED ORDER — SIMETHICONE 80 MG PO CHEW: 80.0000 mg | CHEWABLE_TABLET | ORAL | Status: DC | PRN

## 2016-06-20 MED ORDER — DIPHENHYDRAMINE HCL 25 MG PO CAPS: 25.0000 mg | ORAL_CAPSULE | Freq: Four times a day (QID) | ORAL | Status: DC | PRN

## 2016-06-20 MED ORDER — COCONUT OIL OIL: 1.0000 "application " | TOPICAL_OIL | Status: DC | PRN

## 2016-06-20 MED ORDER — FENTANYL CITRATE (PF) 100 MCG/2ML IJ SOLN
INTRAMUSCULAR | Status: AC
  Filled 2016-06-20: qty 2

## 2016-06-20 MED ORDER — KETOROLAC TROMETHAMINE 30 MG/ML IJ SOLN
30.0000 mg | Freq: Four times a day (QID) | INTRAMUSCULAR | Status: DC | PRN
  Filled 2016-06-20: qty 1

## 2016-06-20 MED ORDER — SIMETHICONE 80 MG PO CHEW
80.0000 mg | CHEWABLE_TABLET | Freq: Three times a day (TID) | ORAL | Status: DC
  Administered 2016-06-20 – 2016-06-21 (×4): 80 mg via ORAL
  Filled 2016-06-20 (×3): qty 1

## 2016-06-20 MED ORDER — KETOROLAC TROMETHAMINE 30 MG/ML IJ SOLN
30.0000 mg | Freq: Four times a day (QID) | INTRAMUSCULAR | Status: DC | PRN
  Administered 2016-06-20: 30 mg via INTRAVENOUS
  Filled 2016-06-20 (×3): qty 1

## 2016-06-20 MED ORDER — BUPIVACAINE HCL 0.5 % IJ SOLN
10.0000 mL | Freq: Once | INTRAMUSCULAR | Status: DC
  Filled 2016-06-20: qty 10

## 2016-06-20 MED ORDER — FENTANYL CITRATE (PF) 100 MCG/2ML IJ SOLN
INTRAMUSCULAR | Status: DC | PRN
  Administered 2016-06-20: 100 ug via INTRAVENOUS

## 2016-06-20 MED ORDER — CEFAZOLIN SODIUM-DEXTROSE 2-3 GM-% IV SOLR
INTRAVENOUS | Status: DC | PRN
  Administered 2016-06-20: 2 g via INTRAVENOUS

## 2016-06-20 MED ORDER — DIPHENHYDRAMINE HCL 50 MG/ML IJ SOLN: 12.5000 mg | INTRAMUSCULAR | Status: DC | PRN

## 2016-06-20 MED ORDER — ZOLPIDEM TARTRATE 5 MG PO TABS: 5.0000 mg | ORAL_TABLET | Freq: Every evening | ORAL | Status: DC | PRN

## 2016-06-20 MED ORDER — ACETAMINOPHEN 500 MG PO TABS
1000.0000 mg | ORAL_TABLET | Freq: Four times a day (QID) | ORAL | Status: DC
  Administered 2016-06-20 (×2): 1000 mg via ORAL
  Filled 2016-06-20 (×2): qty 2

## 2016-06-20 MED ORDER — MORPHINE SULFATE (PF) 0.5 MG/ML IJ SOLN
INTRAMUSCULAR | Status: DC | PRN
Start: 2016-06-20 — End: 2016-06-20
  Administered 2016-06-20: 4 mg via EPIDURAL

## 2016-06-20 MED ORDER — BUPIVACAINE HCL (PF) 0.5 % IJ SOLN
INTRAMUSCULAR | Status: DC | PRN
  Administered 2016-06-20: 30 mL

## 2016-06-20 MED ORDER — SODIUM CHLORIDE 0.9% FLUSH: 3.0000 mL | INTRAVENOUS | Status: DC | PRN

## 2016-06-20 MED ORDER — NALBUPHINE HCL 10 MG/ML IJ SOLN: 5.0000 mg | Freq: Once | INTRAMUSCULAR | Status: DC | PRN

## 2016-06-20 MED ORDER — SIMETHICONE 80 MG PO CHEW
80.0000 mg | CHEWABLE_TABLET | ORAL | Status: DC
  Filled 2016-06-20: qty 1

## 2016-06-20 MED ORDER — NALOXONE HCL 0.4 MG/ML IJ SOLN: 0.4000 mg | INTRAMUSCULAR | Status: DC | PRN

## 2016-06-20 MED ORDER — BUPIVACAINE HCL 0.25 % IJ SOLN: INTRAMUSCULAR | Status: DC | PRN

## 2016-06-20 MED ORDER — BUPIVACAINE 0.5 % ON-Q PUMP SINGLE CATH 400 ML
400.0000 mL | INJECTION | Status: DC
  Filled 2016-06-20: qty 400

## 2016-06-20 MED ORDER — ONDANSETRON HCL 4 MG/2ML IJ SOLN: 4.0000 mg | Freq: Three times a day (TID) | INTRAMUSCULAR | Status: DC | PRN

## 2016-06-20 MED ORDER — SODIUM CHLORIDE 0.9 % IJ SOLN
INTRAMUSCULAR | Status: AC
  Filled 2016-06-20: qty 10

## 2016-06-20 MED ORDER — CEFOXITIN SODIUM-DEXTROSE 2-2.2 GM-% IV SOLR (PREMIX): 2.0000 g | Freq: Once | INTRAVENOUS | Status: DC

## 2016-06-20 MED ORDER — OXYCODONE HCL 5 MG PO TABS
5.0000 mg | ORAL_TABLET | Freq: Four times a day (QID) | ORAL | Status: DC | PRN
  Administered 2016-06-20 (×2): 5 mg via ORAL
  Filled 2016-06-20: qty 1

## 2016-06-20 MED ORDER — OXYCODONE HCL 5 MG PO TABS
5.0000 mg | ORAL_TABLET | Freq: Once | ORAL | Status: DC | PRN
  Filled 2016-06-20: qty 1

## 2016-06-20 MED ORDER — FENTANYL CITRATE (PF) 100 MCG/2ML IJ SOLN
25.0000 ug | INTRAMUSCULAR | Status: DC | PRN
  Filled 2016-06-20: qty 2

## 2016-06-20 MED ORDER — LIDOCAINE HCL (PF) 2 % IJ SOLN
INTRAMUSCULAR | Status: AC
  Filled 2016-06-20: qty 2

## 2016-06-20 MED ORDER — PRENATAL MULTIVITAMIN CH
1.0000 | ORAL_TABLET | Freq: Every day | ORAL | Status: DC
  Administered 2016-06-21 – 2016-06-22 (×2): 1 via ORAL
  Filled 2016-06-20 (×2): qty 1

## 2016-06-20 MED ORDER — MORPHINE SULFATE (PF) 0.5 MG/ML IJ SOLN
INTRAMUSCULAR | Status: AC
  Filled 2016-06-20: qty 10

## 2016-06-20 MED ORDER — DIPHENHYDRAMINE HCL 25 MG PO CAPS: 25.0000 mg | ORAL_CAPSULE | ORAL | Status: DC | PRN

## 2016-06-20 MED ORDER — MENTHOL 3 MG MT LOZG
1.0000 | LOZENGE | OROMUCOSAL | Status: DC | PRN
  Filled 2016-06-20: qty 9

## 2016-06-20 MED ORDER — OXYTOCIN 40 UNITS IN LACTATED RINGERS INFUSION - SIMPLE MED: 2.5000 [IU]/h | INTRAVENOUS | Status: DC

## 2016-06-20 MED ORDER — EPHEDRINE SULFATE 50 MG/ML IJ SOLN
INTRAMUSCULAR | Status: AC
  Filled 2016-06-20: qty 1

## 2016-06-20 MED ORDER — MORPHINE SULFATE (PF) 2 MG/ML IV SOLN: 1.0000 mg | INTRAVENOUS | Status: DC | PRN

## 2016-06-20 MED ORDER — KETOROLAC TROMETHAMINE 30 MG/ML IJ SOLN: 30.0000 mg | Freq: Four times a day (QID) | INTRAMUSCULAR | Status: DC

## 2016-06-20 MED ORDER — OXYCODONE HCL 5 MG/5ML PO SOLN
5.0000 mg | Freq: Once | ORAL | Status: DC | PRN
Start: 2016-06-20 — End: 2016-06-20
  Filled 2016-06-20: qty 5

## 2016-06-20 MED ORDER — NALOXONE HCL 2 MG/2ML IJ SOSY: 1.0000 ug/kg/h | PREFILLED_SYRINGE | INTRAMUSCULAR | Status: DC | PRN

## 2016-06-20 MED ORDER — CEFOXITIN SODIUM-DEXTROSE 2-2.2 GM-% IV SOLR (PREMIX)
INTRAVENOUS | Status: AC
  Filled 2016-06-20: qty 50

## 2016-06-20 MED ORDER — NALBUPHINE HCL 10 MG/ML IJ SOLN: 5.0000 mg | INTRAMUSCULAR | Status: DC | PRN

## 2016-06-20 MED ORDER — OXYCODONE HCL 5 MG PO TABS
10.0000 mg | ORAL_TABLET | ORAL | Status: DC | PRN
  Administered 2016-06-21 – 2016-06-22 (×7): 10 mg via ORAL
  Filled 2016-06-20 (×7): qty 2

## 2016-06-20 MED ORDER — PHENYLEPHRINE HCL 10 MG/ML IJ SOLN
INTRAMUSCULAR | Status: AC
  Filled 2016-06-20: qty 1

## 2016-06-20 MED ORDER — DIBUCAINE 1 % RE OINT: 1.0000 "application " | TOPICAL_OINTMENT | RECTAL | Status: DC | PRN

## 2016-06-20 MED ORDER — ACETAMINOPHEN 325 MG PO TABS: 650.0000 mg | ORAL_TABLET | ORAL | Status: DC | PRN

## 2016-06-20 MED ORDER — DEXTROSE 5 % IV SOLN
INTRAVENOUS | Status: DC | PRN
  Administered 2016-06-20: 2 g via INTRAVENOUS

## 2016-06-20 MED ORDER — KETOROLAC TROMETHAMINE 30 MG/ML IJ SOLN
30.0000 mg | Freq: Four times a day (QID) | INTRAMUSCULAR | Status: AC
  Administered 2016-06-20 – 2016-06-21 (×3): 30 mg via INTRAVENOUS

## 2016-06-20 MED ORDER — WITCH HAZEL-GLYCERIN EX PADS: 1.0000 "application " | MEDICATED_PAD | CUTANEOUS | Status: DC | PRN

## 2016-06-20 MED ORDER — SENNOSIDES-DOCUSATE SODIUM 8.6-50 MG PO TABS
2.0000 | ORAL_TABLET | ORAL | Status: DC
  Administered 2016-06-21: 2 via ORAL
  Filled 2016-06-20: qty 2

## 2016-06-20 MED ORDER — ONDANSETRON HCL 4 MG/2ML IJ SOLN
INTRAMUSCULAR | Status: AC
  Filled 2016-06-20: qty 2

## 2016-06-20 MED ORDER — BUPIVACAINE HCL (PF) 0.5 % IJ SOLN
INTRAMUSCULAR | Status: AC
  Filled 2016-06-20: qty 30

## 2016-06-20 MED ORDER — OXYCODONE-ACETAMINOPHEN 5-325 MG PO TABS
1.0000 | ORAL_TABLET | ORAL | Status: DC | PRN
  Administered 2016-06-20: 1 via ORAL
  Filled 2016-06-20: qty 1

## 2016-06-20 SURGICAL SUPPLY — 23 items
BARRIER ADHS 3X4 INTERCEED (GAUZE/BANDAGES/DRESSINGS) ×2 IMPLANT
CANISTER SUCT 3000ML (MISCELLANEOUS) ×2 IMPLANT
CATH KIT ON-Q SILVERSOAK 5IN (CATHETERS) ×4 IMPLANT
CHLORAPREP W/TINT 26ML (MISCELLANEOUS) ×4 IMPLANT
DRSG OPSITE POSTOP 4X10 (GAUZE/BANDAGES/DRESSINGS) ×2 IMPLANT
ELECT CAUTERY BLADE 6.4 (BLADE) ×2 IMPLANT
ELECT REM PT RETURN 9FT ADLT (ELECTROSURGICAL) ×2
ELECTRODE REM PT RTRN 9FT ADLT (ELECTROSURGICAL) ×1 IMPLANT
GLOVE SKINSENSE NS SZ8.0 LF (GLOVE) ×1
GLOVE SKINSENSE STRL SZ8.0 LF (GLOVE) ×1 IMPLANT
GOWN STRL REUS W/ TWL LRG LVL3 (GOWN DISPOSABLE) ×1 IMPLANT
GOWN STRL REUS W/ TWL XL LVL3 (GOWN DISPOSABLE) ×2 IMPLANT
GOWN STRL REUS W/TWL LRG LVL3 (GOWN DISPOSABLE) ×1
GOWN STRL REUS W/TWL XL LVL3 (GOWN DISPOSABLE) ×2
LIQUID BAND (GAUZE/BANDAGES/DRESSINGS) ×2 IMPLANT
NS IRRIG 1000ML POUR BTL (IV SOLUTION) ×2 IMPLANT
PACK C SECTION AR (MISCELLANEOUS) ×2 IMPLANT
PAD OB MATERNITY 4.3X12.25 (PERSONAL CARE ITEMS) ×2 IMPLANT
PAD PREP 24X41 OB/GYN DISP (PERSONAL CARE ITEMS) ×2 IMPLANT
SUT MAXON ABS #0 GS21 30IN (SUTURE) ×4 IMPLANT
SUT VIC AB 1 CT1 36 (SUTURE) ×6 IMPLANT
SUT VIC AB 2-0 CT1 36 (SUTURE) ×2 IMPLANT
SUT VIC AB 4-0 FS2 27 (SUTURE) ×2 IMPLANT

## 2016-06-20 NOTE — Discharge Instructions (Signed)

## 2016-06-20 NOTE — Anesthesia Procedure Notes (Signed)
Date/Time: 06/20/2016 8:18 AM Performed by: Junious SilkNOLES, Clayvon Parlett Pre-anesthesia Checklist: Patient identified, Emergency Drugs available, Suction available, Patient being monitored and Timeout performed Oxygen Delivery Method: Nasal cannula

## 2016-06-20 NOTE — Progress Notes (Signed)
  Labor Progress Note   26 y.o. G1P0 @ 6013w1d , admitted for  Pregnancy, Labor Management. IOL - GDMA1  Subjective:  Slow progress to get MVUs w ctxs on Pitocin adequate, pt tolearting well from pain and patience level.  Fetal tolerence as well.  No cervical change.  Objective:  BP (!) 104/57   Pulse 80   Temp 98.4 F (36.9 C) (Oral)   Resp 20   Ht 5\' 3"  (1.6 m)   Wt 192 lb (87.1 kg)   SpO2 97%   BMI 34.01 kg/m  Abd: mild Extr: trace to 1+ bilateral pedal edema SVE: CERVIX: 6 cm dilated, 70 effaced w edema, -2 station  EFM: FHR: 150  bpm, variability: moderate,  Accelerations: present   ,  decelerations:  Absent Toco: Frequency: Every 2-3 minutes on Pitocin 5 mU/min  Assessment & Plan:  G1P0 @ 8813w1d, admitted for Pregnancy and Labor/Delivery Management PROTRACTED LABOR, ARREST of DILATION  1. Pain management: epidural. 2. FWB: FHT category 1.  3. ID: GBS negative 4. Labor management: Options discussed, CS. The risks of cesarean section discussed with the patient included but were not limited to: bleeding which may require transfusion or reoperation; infection which may require antibiotics; injury to bowel, bladder, ureters or other surrounding organs; injury to the fetus; need for additional procedures including hysterectomy in the event of a life-threatening hemorrhage; placental abnormalities wth subsequent pregnancies, incisional problems, thromboembolic phenomenon and other postoperative/anesthesia complications. The patient concurred with the proposed plan, giving informed written consent for the procedure.    All discussed with patient, see orders

## 2016-06-20 NOTE — Anesthesia Post-op Follow-up Note (Cosign Needed)
Anesthesia QCDR form completed.        

## 2016-06-20 NOTE — Transfer of Care (Signed)
Immediate Anesthesia Transfer of Care Note  Patient: Kendra Lowe  Procedure(s) Performed: Procedure(s): CESAREAN SECTION (N/A)  Patient Location: PACU  Anesthesia Type:Epidural  Level of Consciousness: awake, alert  and oriented  Airway & Oxygen Therapy: Patient Spontanous Breathing and Patient connected to nasal cannula oxygen  Post-op Assessment: Report given to RN and Post -op Vital signs reviewed and stable  Post vital signs: Reviewed and stable  Last Vitals:  Vitals:   06/20/16 0645 06/20/16 0649  BP:  (!) 104/57  Pulse:  80  Resp: 20   Temp: 36.9 C     Last Pain:  Vitals:   06/20/16 0645  TempSrc: Oral  PainSc: 0-No pain      Patients Stated Pain Goal: Other (Comment) (same) (06/18/16 1237)  Complications: No apparent anesthesia complications

## 2016-06-20 NOTE — Op Note (Signed)
Cesarean Section Procedure Note Indications: failure to progress: arrest of dilation and term intrauterine pregnancy  Pre-operative Diagnosis: Intrauterine pregnancy 7661w2d ;  failure to progress: arrest of dilation and term intrauterine pregnancy Post-operative Diagnosis: same, delivered. Procedure: Low Transverse Cesarean Section Surgeon: Annamarie MajorPaul Vertie Dibbern, MD, FACOG Assistant(s): Duwaine MaxinAshley Smith Anesthesia: Epidural anesthesia Estimated Blood Loss:250 ML Complications: None; patient tolerated the procedure well. Disposition: PACU - hemodynamically stable. Condition: stable  Findings: A female infant in the cephalic presentation. Amniotic fluid - Clear  Birth weight 8-5 lbs.  Apgars of 8 and 9.  Intact placenta with a three-vessel cord. Grossly normal uterus, tubes and ovaries bilaterally. No intraabdominal adhesions were noted.  Procedure Details   The patient was taken to Operating Room, identified as the correct patient and the procedure verified as C-Section Delivery. A Time Out was held and the above information confirmed. After induction of anesthesia, the patient was draped and prepped in the usual sterile manner. A Pfannenstiel incision was made and carried down through the subcutaneous tissue to the fascia. Fascial incision was made and extended transversely with the Mayo scissors. The fascia was separated from the underlying rectus tissue superiorly and inferiorly. The peritoneum was identified and entered bluntly. Peritoneal incision was extended longitudinally. The utero-vesical peritoneal reflection was incised transversely and a bladder flap was created digitally.  A low transverse hysterotomy was made. The fetus was delivered atraumatically. The umbilical cord was clamped x2 and cut and the infant was handed to the awaiting pediatricians. The placenta was removed intact and appeared normal with a 3-vessel cord.  The uterus was exteriorized and cleared of all clot and debris. The  hysterotomy was closed with running sutures of 0 Vicryl suture. A second imbricating layer was placed with the same suture. Excellent hemostasis was observed. The uterus was returned to the abdomen. The pelvis was irrigated and again, excellent hemostasis was noted.  The On Q Pain pump System was then placed.  Trocars were placed through the abdominal wall into the subfascial space and these were used to thread the silver soaker cathaters into place.The rectus fascia was then reapproximated with running sutures of Maxon, with careful placement not to incorporate the cathaters. Subcutaneous tissues are then irrigated with saline and hemostasis assured.  Skin is then closed with 4-0 vicryl suture in a subcuticular fashion followed by skin adhesive. The cathaters are flushed each with 5 mL of Bupivicaine and stabilized into place with dressing. Instrument, sponge, and needle counts were correct prior to the abdominal closure and at the conclusion of the case.  The patient tolerated the procedure well and was transferred to the recovery room in stable condition.

## 2016-06-20 NOTE — Discharge Summary (Signed)
Obstetrical Discharge Summary  Date of Admission: 06/17/2016 Date of Discharge: 06/22/16  Discharge Diagnosis: Term Pregnancy-delivered, Failed induction and Arrest of dilation Primary OB:  [facility]   Gestational Age at Delivery: 1017w2d  Antepartum complications: Term Pregnancy-delivered, Failed induction and Arrest of dilation Date of Delivery: 06/22/2016  Delivered By: Annamarie MajorPaul Harris, MD Delivery Type: primary cesarean section, low transverse incision Intrapartum complications/course: Failure to Progress Anesthesia: epidural Placenta: manual removal Laceration: n/a Episiotomy: none Live born female  Birth Weight: 8 lb 5.3 oz (3780 g) APGAR: 8, 9   Post partum course: Since the delivery, patient has tolerate activity, diet, and daily functions without difficulty or complication.  Min lochia.  No breast concerns at this time.  No signs of depression currently.   Postpartum Exam:General appearance: alert, appears stated age and no distress GI: soft, non-tender; bowel sounds normal; no masses,  no organomegaly, Fundus firm and incision D/C/I Extremities: extremities normal, atraumatic, no cyanosis or edema  Disposition: home with infant Rh Immune globulin given: no Rubella vaccine given: no Varicella vaccine given: no Tdap vaccine given in AP or PP setting: yes Contraception: to be determined at post partum visit  Prenatal Labs: A POS//Rubella Immune//RPR negative//HIV negative/HepB Surface Ag negative//plans to breastfeed  Plan:  Kendra HoneyAmber Michelle Lowe was discharged to home in good condition. [facility]  Discharge Medications: Allergies as of 06/22/2016      Reactions   Nickel Rash      Medication List    TAKE these medications   ferrous sulfate 325 (65 FE) MG tablet Take 1 tablet (325 mg total) by mouth daily with breakfast.   ibuprofen 600 MG tablet Commonly known as:  ADVIL,MOTRIN Take 1 tablet (600 mg total) by mouth every 6  (six) hours.   multivitamin-prenatal 27-0.8 MG Tabs tablet Take 1 tablet by mouth daily at 12 noon.   oxyCODONE-acetaminophen 5-325 MG tablet Commonly known as:  PERCOCET/ROXICET Take 1-2 tablets by mouth every 4 (four) hours as needed.       Follow-up arrangements:  1 week harris   No future appointments.  Kendra AustriaAndreas Khanh Tanori, MD

## 2016-06-21 LAB — CBC
HEMATOCRIT: 23.6 % — AB (ref 35.0–47.0)
HEMOGLOBIN: 7.7 g/dL — AB (ref 12.0–16.0)
MCH: 26.8 pg (ref 26.0–34.0)
MCHC: 32.7 g/dL (ref 32.0–36.0)
MCV: 82.1 fL (ref 80.0–100.0)
PLATELETS: 241 10*3/uL (ref 150–440)
RBC: 2.87 MIL/uL — AB (ref 3.80–5.20)
RDW: 13.3 % (ref 11.5–14.5)
WBC: 17 10*3/uL — AB (ref 3.6–11.0)

## 2016-06-21 MED ORDER — VITAMIN C 500 MG PO TABS
500.0000 mg | ORAL_TABLET | Freq: Two times a day (BID) | ORAL | Status: DC
  Administered 2016-06-21 (×2): 500 mg via ORAL
  Filled 2016-06-21 (×3): qty 1

## 2016-06-21 MED ORDER — DOCUSATE SODIUM 100 MG PO CAPS
100.0000 mg | ORAL_CAPSULE | Freq: Every day | ORAL | Status: DC
  Administered 2016-06-21 – 2016-06-22 (×2): 100 mg via ORAL
  Filled 2016-06-21 (×2): qty 1

## 2016-06-21 MED ORDER — IBUPROFEN 600 MG PO TABS
600.0000 mg | ORAL_TABLET | Freq: Four times a day (QID) | ORAL | Status: DC
  Administered 2016-06-21 – 2016-06-22 (×4): 600 mg via ORAL
  Filled 2016-06-21 (×4): qty 1

## 2016-06-21 MED ORDER — OXYCODONE HCL 5 MG PO TABS: 5.0000 mg | ORAL_TABLET | ORAL | Status: DC | PRN

## 2016-06-21 MED ORDER — FERROUS SULFATE 325 (65 FE) MG PO TABS
325.0000 mg | ORAL_TABLET | Freq: Two times a day (BID) | ORAL | Status: DC
  Administered 2016-06-21 – 2016-06-22 (×2): 325 mg via ORAL
  Filled 2016-06-21 (×2): qty 1

## 2016-06-21 MED FILL — Bupivacaine HCl Inj 0.25%: Qty: 400 | Status: AC

## 2016-06-21 NOTE — Progress Notes (Signed)
POD #1 CS for FITL. GDMA1 Subjective:  Breast feeding. Has been up to void. Denies lightheadedness when OOB. Tolerating a regular diet. Passing flatus. Good pain control with po analgesics/ ON Q pump  Objective:  Blood pressure 118/74, pulse 82, temperature 97.3 F (36.3 C), temperature source Oral, resp. rate 18, height 5\' 3"  (1.6 m), weight 87.1 kg (192 lb), SpO2 96 %, unknown if currently breastfeeding.  General: NAD, alert, cooperative, pale Pulmonary: no increased work of breathing, CTAB Heart: RRR without murmur Abdomen: soft, but tympanic, BS active, fundus firm at level of umbilicus-1/ML/appropriately tender Incision: honey comb dressing C+D+I, ON Q intact Extremities: mild edema, no erythema, no tenderness  Results for orders placed or performed during the hospital encounter of 06/17/16 (from the past 72 hour(s))  Glucose, capillary     Status: Abnormal   Collection Time: 06/18/16 12:14 PM  Result Value Ref Range   Glucose-Capillary 118 (H) 65 - 99 mg/dL  Glucose, capillary     Status: Abnormal   Collection Time: 06/18/16  4:20 PM  Result Value Ref Range   Glucose-Capillary 108 (H) 65 - 99 mg/dL  Glucose, capillary     Status: Abnormal   Collection Time: 06/19/16 12:09 AM  Result Value Ref Range   Glucose-Capillary 127 (H) 65 - 99 mg/dL  CBC     Status: Abnormal   Collection Time: 06/19/16  4:19 AM  Result Value Ref Range   WBC 16.2 (H) 3.6 - 11.0 K/uL   RBC 3.79 (L) 3.80 - 5.20 MIL/uL   Hemoglobin 10.1 (L) 12.0 - 16.0 g/dL   HCT 16.1 (L) 09.6 - 04.5 %   MCV 83.1 80.0 - 100.0 fL   MCH 26.8 26.0 - 34.0 pg   MCHC 32.2 32.0 - 36.0 g/dL   RDW 40.9 81.1 - 91.4 %   Platelets 310 150 - 440 K/uL  Glucose, capillary     Status: Abnormal   Collection Time: 06/19/16  8:42 AM  Result Value Ref Range   Glucose-Capillary 112 (H) 65 - 99 mg/dL  Glucose, capillary     Status: None   Collection Time: 06/19/16  7:41 PM  Result Value Ref Range   Glucose-Capillary 82 65 - 99 mg/dL   Glucose, capillary     Status: None   Collection Time: 06/20/16 12:34 AM  Result Value Ref Range   Glucose-Capillary 95 65 - 99 mg/dL  Glucose, capillary     Status: Abnormal   Collection Time: 06/20/16  7:23 AM  Result Value Ref Range   Glucose-Capillary 105 (H) 65 - 99 mg/dL   Comment 1 Document in Chart   CBC     Status: Abnormal   Collection Time: 06/21/16  5:58 AM  Result Value Ref Range   WBC 17.0 (H) 3.6 - 11.0 K/uL   RBC 2.87 (L) 3.80 - 5.20 MIL/uL   Hemoglobin 7.7 (L) 12.0 - 16.0 g/dL   HCT 78.2 (L) 95.6 - 21.3 %   MCV 82.1 80.0 - 100.0 fL   MCH 26.8 26.0 - 34.0 pg   MCHC 32.7 32.0 - 36.0 g/dL   RDW 08.6 57.8 - 46.9 %   Platelets 241 150 - 440 K/uL     Assessment:   26 y.o. G1P1001 postoperativeday # 1-stable  Ambulate with assist  Continue Post-op/ postpartum care   Plan:  1) Acute blood loss anemia - hemodynamically stable and asymptomatic - po ferrous sulfate/ vitamiins -safety precautions  2) A POS/ RI/ VI  3) TDAP  UTD (04/12/2016)   4) Breast/Bottle  5) Disposition-possibly tomorrow  Farrel Connersolleen Selwyn Reason, CNM  6.)Contraception: undecided

## 2016-06-21 NOTE — Lactation Note (Signed)
This note was copied from a baby's chart. Lactation Consultation Note  Patient Name: Kendra Lowe Today's Date: 06/21/2016  We also discussed trying to feed baby per cues and trying to look for cues at least every 2-3 hours.    Maternal Data    Feeding Feeding Type: Bottle Fed - Formula Nipple Type: Slow - flow  LATCH Score/Interventions                      Lactation Tools Discussed/Used     Consult Status      Sunday CornSandra Clark Jaycion Treml 06/21/2016, 6:33 PM

## 2016-06-21 NOTE — Lactation Note (Signed)
This note was copied from a baby's chart. Lactation Consultation Note  Patient Name: Kendra Lowe Today's Date: 06/21/2016  Mom c/o fussy, crying baby and that she could not get him latched on even with the shield so she gave him 10 ml formula. He spit up 1-2 mls of it. I reviewed options to help her. She agreed to try manual breast pump to evert nipple and then try to apply nipple shield. She immediately got drops of colostrum and everted her nipple within a couple minutes. She was then able to better apply nipple shield. Mom to try this plan tonight and try to minimize/avoid formula if possible. She also c/o being "tired and too many people in her room" RN Kim and I made some suggestions to help with that.   Maternal Data    Feeding    LATCH Score/Interventions                      Lactation Tools Discussed/Used     Consult Status      Sunday CornSandra Clark Barba Solt 06/21/2016, 6:28 PM

## 2016-06-21 NOTE — Anesthesia Postprocedure Evaluation (Signed)
Anesthesia Post Note  Patient: AcupuncturistAmber Michelle Lowe  Procedure(s) Performed: Procedure(s) (LRB): CESAREAN SECTION (N/A)  Patient location during evaluation: Mother Baby Anesthesia Type: Epidural Level of consciousness: awake, awake and alert and oriented Pain management: pain level controlled Vital Signs Assessment: post-procedure vital signs reviewed and stable Respiratory status: spontaneous breathing Cardiovascular status: blood pressure returned to baseline Postop Assessment: no headache, no backache, no signs of nausea or vomiting and adequate PO intake Anesthetic complications: no     Last Vitals:  Vitals:   06/20/16 2347 06/21/16 0334  BP: 114/67 121/72  Pulse: 83 79  Resp: 16   Temp: 37.2 C 36.5 C    Last Pain:  Vitals:   06/21/16 0538  TempSrc:   PainSc: 6                  Kendra Lowe

## 2016-06-21 NOTE — Anesthesia Post-op Follow-up Note (Signed)
  Anesthesia Pain Follow-up Note  Patient: Kendra Lowe  Day #: 1  Date of Follow-up: 06/21/2016 Time: 7:08 AM  Last Vitals:  Vitals:   06/20/16 2347 06/21/16 0334  BP: 114/67 121/72  Pulse: 83 79  Resp: 16   Temp: 37.2 C 36.5 C    Level of Consciousness: alert  Pain: none   Side Effects:None  Catheter Site Exam:clean, dry, no drainage     Plan: D/C from anesthesia care at surgeon's request  Karoline Caldwelleana Pawel Soules

## 2016-06-22 ENCOUNTER — Ambulatory Visit: Payer: Self-pay

## 2016-06-22 MED ORDER — OXYCODONE-ACETAMINOPHEN 5-325 MG PO TABS: 1.0000 | ORAL_TABLET | ORAL | 0 refills | Status: DC | PRN

## 2016-06-22 MED ORDER — FERROUS SULFATE 325 (65 FE) MG PO TABS: 325.0000 mg | ORAL_TABLET | Freq: Every day | ORAL | 3 refills | Status: DC

## 2016-06-22 MED ORDER — IBUPROFEN 600 MG PO TABS: 600.0000 mg | ORAL_TABLET | Freq: Four times a day (QID) | ORAL | 0 refills | Status: DC

## 2016-06-22 NOTE — Progress Notes (Signed)
Discharge teaching completed.  Rx given pt for home use.  Discussed how to remove ON Q pump.  Pt verb u/o

## 2016-06-22 NOTE — Progress Notes (Signed)
Discharge to home to car in car seat with mom.  Verb U/O

## 2016-06-22 NOTE — Lactation Note (Signed)
This note was copied from a baby's chart. Lactation Consultation Note  Patient Name: Kendra Lonna Cobbmber Mcphatter WGNFA'OToday's Date: 06/22/2016  Mom declined LC assist today per RN Lodema Pilotobin Smith who says mom plans to pump and bottle feed more and has a pump at home.    Maternal Data    Feeding Feeding Type: Bottle Fed - Formula Nipple Type: Slow - flow  LATCH Score/Interventions                      Lactation Tools Discussed/Used     Consult Status      Sunday CornSandra Clark Mallie Giambra 06/22/2016, 1:56 PM

## 2016-06-24 ENCOUNTER — Telehealth: Payer: Self-pay | Admitting: Obstetrics & Gynecology

## 2016-06-24 NOTE — Telephone Encounter (Signed)
Pt is calling about severe swelling on her legs. Pt reports she had a C- section on 06/20/16. Please call [phone]

## 2016-07-04 ENCOUNTER — Ambulatory Visit (INDEPENDENT_AMBULATORY_CARE_PROVIDER_SITE_OTHER): Payer: Medicaid Other | Admitting: Obstetrics & Gynecology

## 2016-07-04 DIAGNOSIS — Z30013 Encounter for initial prescription of injectable contraceptive: Secondary | ICD-10-CM

## 2016-07-04 DIAGNOSIS — Z9889 Other specified postprocedural states: Secondary | ICD-10-CM

## 2016-07-04 MED ORDER — MEDROXYPROGESTERONE ACETATE 150 MG/ML IM SUSP: 150.0000 mg | INTRAMUSCULAR | 3 refills | Status: DC

## 2016-07-04 MED ORDER — METOCLOPRAMIDE HCL 10 MG PO TABS: 10.0000 mg | ORAL_TABLET | Freq: Three times a day (TID) | ORAL | 12 refills | Status: DC

## 2016-07-04 NOTE — Progress Notes (Signed)
  Postoperative Follow-up Patient presents post op from cesarean section 2 weeks ago for FTP.  Subjective: Patient reports marked improvement in her preop symptoms. Eating a regular diet without difficulty. The patient is not having any pain.  Activity: normal activities of daily living. Patient reports vaginal sx's of None  Objective: AF, BP 110/70, P 80 Physical Exam  Constitutional: She is oriented to person, place, and time. She appears well-developed and well-nourished. No distress.  Cardiovascular: Normal rate.   Pulmonary/Chest: Effort normal.  Abdominal: Soft. She exhibits no distension. There is no tenderness.  Incision Healing Well   Musculoskeletal: Normal range of motion.  Neurological: She is alert and oriented to person, place, and time. No cranial nerve deficit.  Skin: Skin is warm and dry.  Psychiatric: She has a normal mood and affect.    Assessment: s/p :  cesarean section stable  Plan: Patient has done well after surgery with no apparent complications.  I have discussed the post-operative course to date, and the expected progress moving forward.  The patient understands what complications to be concerned about.  I will see the patient in routine follow up, or sooner if needed.    Activity plan: No restriction. Plans Depo Reglan for breast feeding  Letitia LibraRobert Paul Bailie Christenbury 07/04/2016, 8:35 AM

## 2016-07-27 ENCOUNTER — Telehealth: Payer: Self-pay | Admitting: Obstetrics & Gynecology

## 2016-07-27 NOTE — Telephone Encounter (Signed)
Pt is being reschedule from 08/03/16 due to [facility] being out of this office . Pt is schedule to next available opening 08/23/16. Pt is requesting an returned to work note. Please advise.

## 2016-07-27 NOTE — Telephone Encounter (Signed)
Yes of course. Thx

## 2016-07-27 NOTE — Telephone Encounter (Signed)
Pt wants to return to work on 4/18. Do you want me to make note for this?

## 2016-07-27 NOTE — Telephone Encounter (Signed)
Note ready = pt aware  

## 2016-08-03 ENCOUNTER — Encounter: Payer: Medicaid Other | Admitting: Obstetrics & Gynecology

## 2016-08-23 ENCOUNTER — Encounter: Payer: Medicaid Other | Admitting: Obstetrics & Gynecology

## 2017-10-28 ENCOUNTER — Other Ambulatory Visit: Payer: Self-pay

## 2017-10-28 ENCOUNTER — Emergency Department: Payer: Medicaid Other

## 2017-10-28 ENCOUNTER — Inpatient Hospital Stay
Admission: EM | Admit: 2017-10-28 | Discharge: 2017-10-30 | DRG: 190 | Disposition: A | Discharge status: Home / Self Care | Payer: Medicaid Other | Attending: Internal Medicine | Admitting: Internal Medicine

## 2017-10-28 ENCOUNTER — Encounter: Payer: Self-pay | Admitting: Emergency Medicine

## 2017-10-28 DIAGNOSIS — Z833 Family history of diabetes mellitus: Secondary | ICD-10-CM

## 2017-10-28 DIAGNOSIS — J209 Acute bronchitis, unspecified: Secondary | ICD-10-CM | POA: Diagnosis present

## 2017-10-28 DIAGNOSIS — Z599 Problem related to housing and economic circumstances, unspecified: Secondary | ICD-10-CM

## 2017-10-28 DIAGNOSIS — E876 Hypokalemia: Secondary | ICD-10-CM | POA: Diagnosis present

## 2017-10-28 DIAGNOSIS — J9602 Acute respiratory failure with hypercapnia: Secondary | ICD-10-CM | POA: Diagnosis present

## 2017-10-28 DIAGNOSIS — Z8632 Personal history of gestational diabetes: Secondary | ICD-10-CM

## 2017-10-28 DIAGNOSIS — J9601 Acute respiratory failure with hypoxia: Secondary | ICD-10-CM | POA: Diagnosis present

## 2017-10-28 DIAGNOSIS — R7303 Prediabetes: Secondary | ICD-10-CM | POA: Diagnosis present

## 2017-10-28 DIAGNOSIS — Z23 Encounter for immunization: Secondary | ICD-10-CM

## 2017-10-28 DIAGNOSIS — F17210 Nicotine dependence, cigarettes, uncomplicated: Secondary | ICD-10-CM | POA: Diagnosis present

## 2017-10-28 DIAGNOSIS — Z8701 Personal history of pneumonia (recurrent): Secondary | ICD-10-CM | POA: Diagnosis not present

## 2017-10-28 DIAGNOSIS — J441 Chronic obstructive pulmonary disease with (acute) exacerbation: Secondary | ICD-10-CM | POA: Diagnosis not present

## 2017-10-28 DIAGNOSIS — Z803 Family history of malignant neoplasm of breast: Secondary | ICD-10-CM | POA: Diagnosis not present

## 2017-10-28 DIAGNOSIS — Z9114 Patient's other noncompliance with medication regimen: Secondary | ICD-10-CM

## 2017-10-28 DIAGNOSIS — Z801 Family history of malignant neoplasm of trachea, bronchus and lung: Secondary | ICD-10-CM

## 2017-10-28 DIAGNOSIS — Z91048 Other nonmedicinal substance allergy status: Secondary | ICD-10-CM

## 2017-10-28 DIAGNOSIS — J440 Chronic obstructive pulmonary disease with acute lower respiratory infection: Secondary | ICD-10-CM | POA: Diagnosis present

## 2017-10-28 DIAGNOSIS — J45901 Unspecified asthma with (acute) exacerbation: Secondary | ICD-10-CM | POA: Diagnosis present

## 2017-10-28 DIAGNOSIS — J9801 Acute bronchospasm: Secondary | ICD-10-CM

## 2017-10-28 DIAGNOSIS — R0602 Shortness of breath: Secondary | ICD-10-CM

## 2017-10-28 DIAGNOSIS — R739 Hyperglycemia, unspecified: Secondary | ICD-10-CM | POA: Diagnosis present

## 2017-10-28 HISTORY — DX: Unspecified asthma, uncomplicated: J45.909

## 2017-10-28 LAB — BASIC METABOLIC PANEL
Anion gap: 12 (ref 5–15)
BUN: 6 mg/dL (ref 6–20)
CALCIUM: 9.1 mg/dL (ref 8.9–10.3)
CHLORIDE: 92 mmol/L — AB (ref 98–111)
CO2: 30 mmol/L (ref 22–32)
Creatinine, Ser: 0.58 mg/dL (ref 0.44–1.00)
GFR calc non Af Amer: >60 mL/min (ref >60)
GLUCOSE: 197 mg/dL — AB (ref 70–99)
Potassium: 3.4 mmol/L — ABNORMAL LOW (ref 3.5–5.1)
Sodium: 134 mmol/L — ABNORMAL LOW (ref 135–145)

## 2017-10-28 LAB — CBC
HEMATOCRIT: 39.6 % (ref 35.0–47.0)
HEMOGLOBIN: 13.1 g/dL (ref 12.0–16.0)
MCH: 27.4 pg (ref 26.0–34.0)
MCHC: 33 g/dL (ref 32.0–36.0)
MCV: 83 fL (ref 80.0–100.0)
Platelets: 412 10*3/uL (ref 150–440)
RBC: 4.77 MIL/uL (ref 3.80–5.20)
RDW: 15.3 % — AB (ref 11.5–14.5)
WBC: 22.5 10*3/uL — ABNORMAL HIGH (ref 3.6–11.0)

## 2017-10-28 LAB — HCG, QUANTITATIVE, PREGNANCY: hCG, Beta Chain, Quant, S: <1 m[IU]/mL (ref <5)

## 2017-10-28 LAB — LACTIC ACID, PLASMA: LACTIC ACID, VENOUS: 1.5 mmol/L (ref 0.5–1.9)

## 2017-10-28 MED ORDER — ALBUTEROL SULFATE (2.5 MG/3ML) 0.083% IN NEBU
INHALATION_SOLUTION | RESPIRATORY_TRACT | Status: AC
  Filled 2017-10-28: qty 12

## 2017-10-28 MED ORDER — ALBUTEROL SULFATE (2.5 MG/3ML) 0.083% IN NEBU
7.5000 mg | INHALATION_SOLUTION | RESPIRATORY_TRACT | Status: AC
  Administered 2017-10-28: 7.5 mg via RESPIRATORY_TRACT

## 2017-10-28 MED ORDER — ALBUTEROL SULFATE (2.5 MG/3ML) 0.083% IN NEBU
INHALATION_SOLUTION | RESPIRATORY_TRACT | Status: AC
  Filled 2017-10-28: qty 9

## 2017-10-28 MED ORDER — SODIUM CHLORIDE 0.9 % IV SOLN
2.0000 g | Freq: Once | INTRAVENOUS | Status: AC
  Administered 2017-10-28: 2 g via INTRAVENOUS
  Filled 2017-10-28: qty 20

## 2017-10-28 MED ORDER — METHYLPREDNISOLONE SODIUM SUCC 125 MG IJ SOLR
125.0000 mg | INTRAMUSCULAR | Status: AC
  Administered 2017-10-28: 125 mg via INTRAVENOUS

## 2017-10-28 MED ORDER — SODIUM CHLORIDE 0.9 % IV SOLN
INTRAVENOUS | Status: AC
  Filled 2017-10-28: qty 500

## 2017-10-28 MED ORDER — IPRATROPIUM-ALBUTEROL 0.5-2.5 (3) MG/3ML IN SOLN
3.0000 mL | Freq: Once | RESPIRATORY_TRACT | Status: AC
  Administered 2017-10-28: 3 mL via RESPIRATORY_TRACT

## 2017-10-28 MED ORDER — IPRATROPIUM-ALBUTEROL 0.5-2.5 (3) MG/3ML IN SOLN
RESPIRATORY_TRACT | Status: AC
  Filled 2017-10-28: qty 9

## 2017-10-28 MED ORDER — MAGNESIUM SULFATE 2 GM/50ML IV SOLN
2.0000 g | Freq: Once | INTRAVENOUS | Status: AC
  Administered 2017-10-28: 2 g via INTRAVENOUS

## 2017-10-28 MED ORDER — ALBUTEROL SULFATE (2.5 MG/3ML) 0.083% IN NEBU
INHALATION_SOLUTION | RESPIRATORY_TRACT | Status: AC
  Filled 2017-10-28: qty 6

## 2017-10-28 MED ORDER — ALBUTEROL SULFATE (2.5 MG/3ML) 0.083% IN NEBU
10.0000 mg | INHALATION_SOLUTION | Freq: Once | RESPIRATORY_TRACT | Status: AC
  Administered 2017-10-28: 10 mg via RESPIRATORY_TRACT

## 2017-10-28 MED ORDER — ALBUTEROL (5 MG/ML) CONTINUOUS INHALATION SOLN: 7.5000 mg/h | INHALATION_SOLUTION | RESPIRATORY_TRACT | Status: DC

## 2017-10-28 MED ORDER — SODIUM CHLORIDE 0.9 % IV BOLUS
500.0000 mL | Freq: Once | INTRAVENOUS | Status: AC
  Administered 2017-10-28: 500 mL via INTRAVENOUS

## 2017-10-28 MED ORDER — METHYLPREDNISOLONE SODIUM SUCC 125 MG IJ SOLR
INTRAMUSCULAR | Status: AC
  Filled 2017-10-28: qty 2

## 2017-10-28 MED ORDER — CEFTRIAXONE SODIUM 1 G IJ SOLR
INTRAMUSCULAR | Status: AC
  Filled 2017-10-28: qty 10

## 2017-10-28 MED ORDER — MAGNESIUM SULFATE 2 GM/50ML IV SOLN
INTRAVENOUS | Status: AC
  Filled 2017-10-28: qty 50

## 2017-10-28 MED ORDER — SODIUM CHLORIDE 0.9 % IV SOLN
500.0000 mg | INTRAVENOUS | Status: DC
  Administered 2017-10-28: 500 mg via INTRAVENOUS

## 2017-10-28 NOTE — ED Triage Notes (Addendum)
Pt presented with pursed lip breathing, c/o shortness of breath worsening over the last few days; pt says since January she's had several bouts with pneumonia and bronchitis; sats at the highest 64% in triage; brought straight to room 10 and placed on nonrebreather mask; sats up to 100%; pt admits to being a ppd smoker but has tired to cut back lately as she's been feeling bad

## 2017-10-28 NOTE — ED Notes (Signed)
Pt tolerating removal of bipap well.

## 2017-10-28 NOTE — ED Notes (Signed)
Dr. Fanny BienQuale notified regarding critical po2 of 31 and co2 61. Orders for additional albuterol and duoneb received.

## 2017-10-28 NOTE — ED Notes (Signed)
Pt taken straight to treatment room 10; April, RN informed of pt's sats and Dr Fanny BienQuale immediately in to see pt; nonrebreather placed on pt;

## 2017-10-28 NOTE — ED Notes (Signed)
Per dr. Anne HahnWillis would like to wean pt from bipap. RT notified.

## 2017-10-28 NOTE — ED Notes (Signed)
Pt on 4lpm oxygen via [place] currently.

## 2017-10-28 NOTE — ED Notes (Signed)
Pt down to 90% on 4lpm, RT notified, pt placed on venti mask at 45%.

## 2017-10-28 NOTE — ED Notes (Signed)
Dr. Anne HahnWillis notified regarding change in pt's oxygenation status and placement of high flow nasal cannula. md to change admit order to step down. Report previously called to phyliss, rn on 1a, 1a notified of change in order status.

## 2017-10-28 NOTE — ED Notes (Signed)
Pt on 8lpm high flow nasal cannula.

## 2017-10-28 NOTE — ED Provider Notes (Signed)
[facility] Emergency Department Provider Note   ____________________________________________   First MD Initiated Contact with Patient 10/28/17 2030     (approximate)  I have reviewed the triage vital signs and the nursing notes.   HISTORY  Chief Complaint Severe shortness of breath  EM caveat: Patient presents severely short of breath.  Lipid to severe dyspnea.  HPI Kendra Lowe is a 27 y.o. female here for severe shortness of breath.  Shortness of breath for about a week but significant worsening over the last day.  Reports very short of breath.  Feels she is been "wheezing".  Reports she is supposed to see Dr. but has not been able to due to financial problems.  No chest pain.  Reports she feels tight and very wheezy with a non-productive cough.  No fevers or chills.  No abdominal pain.  Denies pregnancy.    Past Medical History:  Diagnosis Date  . Asthma   . Atypical squamous cell changes of undetermined significance (ASCUS) on cervical cytology with positive high risk human papilloma virus (HPV) 12/09/2015  . Medical history non-contributory     Patient Active Problem List   Diagnosis Date Noted  . Asthma exacerbation 10/28/2017  . Hyperglycemia 10/28/2017  . Diet controlled gestational diabetes mellitus (GDM) in third trimester 06/17/2016  . Proximal humerus fracture 04/03/2011    Past Surgical History:  Procedure Laterality Date  . CESAREAN SECTION N/A 06/20/2016   Procedure: CESAREAN SECTION;  Surgeon: Nadara Mustard, MD;  Location: [facility] ORS;  Service: Obstetrics;  Laterality: N/A;  . ORIF HUMERUS FRACTURE  04/03/2011   Procedure: OPEN REDUCTION INTERNAL FIXATION (ORIF) PROXIMAL HUMERUS FRACTURE;  Surgeon: Kathryne Hitch;  Location: [facility] OR;  Service: Orthopedics;  Laterality: Right;  . SHOULDER SURGERY Right 03/2011  . SHOULDER SURGERY     plate and pins in shoulder    Prior to Admission medications   Not on File      Allergies Nickel  Family History  Problem Relation Age of Onset  . Diabetes Paternal Aunt   . Lung cancer Paternal Aunt   . Breast cancer Paternal Grandfather     Social History Social History   Tobacco Use  . Smoking status: Current Every Day Smoker    Packs/day: 0.50  . Smokeless tobacco: Never Used  . Tobacco comment: recently quit  Substance Use Topics  . Alcohol use: No  . Drug use: No    Review of Systems Constitutional: No fever/chills Eyes: No visual changes. ENT: No sore throat. Cardiovascular: Denies chest pain.  Feels somewhat tight Respiratory: See HPI gastrointestinal: No abdominal pain.  No nausea, no vomiting.  No diarrhea.  No constipation. Genitourinary: Negative for dysuria.   ____________________________________________   PHYSICAL EXAM:  VITAL SIGNS: ED Triage Vitals  Enc Vitals Group     BP 10/28/17 2021 115/88     Pulse Rate 10/28/17 2021 (!) 151     Resp 10/28/17 2021 (!) 24     Temp 10/28/17 2021 98.6 F (37 C)     Temp Source 10/28/17 2021 Oral     SpO2 10/28/17 2021 (!) 64 %     Weight 10/28/17 2031 133 lb (60.3 kg)     Height 10/28/17 2031 5' 2.5" (1.588 m)     Head Circumference --      Peak Flow --      Pain Score 10/28/17 2030 8     Pain Loc --      Pain  Edu? --      Excl. in GC? --     Constitutional: Alert and oriented.  Severe tachypnea, tripoding. Eyes: Conjunctivae are normal. Head: Atraumatic. Nose: No congestion/rhinnorhea. Mouth/Throat: Mucous membranes are moist. Neck: No stridor.   Cardiovascular: Notably tachycardic rate, regular rhythm. Grossly normal heart sounds.  Good peripheral circulation. Respiratory: Severe increased work of breathing, tripoding, and hypoxic.  Severe use of accessory muscles.  Gastrointestinal: Soft and nontender. No distention. Musculoskeletal: No lower extremity tenderness nor edema. Neurologic:  Normal speech and language. No gross focal neurologic deficits are appreciated.   Skin:  Skin is warm, dry and intact. No rash noted. Psychiatric: Mood and affect are normal. Speech and behavior are normal.  ____________________________________________   LABS (all labs ordered are listed, but only abnormal results are displayed)  Labs Reviewed  CBC - Abnormal; Notable for the following components:      Result Value   WBC 22.5 (*)    RDW 15.3 (*)    All other components within normal limits  BASIC METABOLIC PANEL - Abnormal; Notable for the following components:   Sodium 134 (*)    Potassium 3.4 (*)    Chloride 92 (*)    Glucose, Bld 197 (*)    All other components within normal limits  BLOOD GAS, VENOUS - Abnormal; Notable for the following components:   pCO2, Ven 61 (*)    pO2, Ven <31.0 (*)    Bicarbonate 32.9 (*)    Acid-Base Excess 5.4 (*)    All other components within normal limits  CULTURE, BLOOD (ROUTINE X 2)  CULTURE, BLOOD (ROUTINE X 2)  HCG, QUANTITATIVE, PREGNANCY  LACTIC ACID, PLASMA  HEMOGLOBIN A1C   ____________________________________________  EKG  Reviewed enterotomy at 2035 Heart rate 130 QRS 89 QTC 440 Sinus tachycardia no ischemic changes ____________________________________________  RADIOLOGY  Dg Chest Port 1 View  Result Date: 10/28/2017 CLINICAL DATA:  Shortness of breath worsening over the last few days; pt says since January she's had several bouts with pneumonia and bronchitis EXAM: PORTABLE CHEST 1 VIEW COMPARISON:  CT, 06/30/2014 FINDINGS: Cardiac silhouette is normal size and configuration. Normal mediastinal and hilar contours. Lungs are clear.  No pleural effusion or pneumothorax. Status post ORIF of a right proximal humerus fracture. Skeletal structures otherwise unremarkable. IMPRESSION: No active disease. Electronically Signed   By: Amie Portland M.D.   On: 10/28/2017 20:58    ____________________________________________   PROCEDURES  Procedure(s) performed: None  Procedures  Critical Care performed:  Yes, see critical care note(s)  CRITICAL CARE Performed by: Sharyn Creamer   Total critical care time: 40 minutes  Critical care time was exclusive of separately billable procedures and treating other patients.  Critical care was necessary to treat or prevent imminent or life-threatening deterioration.  Critical care was time spent personally by me on the following activities: development of treatment plan with patient and/or surrogate as well as nursing, discussions with consultants, evaluation of patient's response to treatment, examination of patient, obtaining history from patient or surrogate, ordering and performing treatments and interventions, ordering and review of laboratory studies, ordering and review of radiographic studies, pulse oximetry and re-evaluation of patient's condition.  ____________________________________________   INITIAL IMPRESSION / ASSESSMENT AND PLAN / ED COURSE  Pertinent labs & imaging results that were available during my care of the patient were reviewed by me and considered in my medical decision making (see chart for details).  Patient was for severe dyspnea.    ----------------------------------------- 8:33 PM on  10/28/2017 -----------------------------------------  Patient work of breathing improving.  Oxygen saturation much improved now currently on nonrebreather.  She continues to sit upright, on BiPAP tolerating it well at this time.  Chest x-ray reviewed, no pneumothorax noted.  Patient presentation appears to demonstrate a sudden severe acute onset of dyspnea with worsening wheezing over the last several days.  ----------------------------------------- 9:36 PM on 10/28/2017 -----------------------------------------  Patient symptoms improving.  Currently sitting on her phone using it well as can have BiPAP removed.  She appears well, able to speak over BiPAP and much improved.  Now moderate wheezing throughout, suspect she is so tight  initially that was somewhat hard to auscultate due to severe bronchospasm.  She is markedly improved, resting comfortably with a almost normal respiratory pattern with BiPAP on.  Plan to trial taking BiPAP off, but patient currently working on continuous albuterol at this time.  The patient's presentation, severity of illness and notable leukocytosis will initiate community-acquired antibiotics.  She denies any recent hospitalizations or risk factors for resistant disease.  Likely suspect bronchitis with severe bronchospasm leading to today's episode of severe hypoxia.  Markedly improving.  Case discussed with hospitalist Dr. Anne HahnWillis.  Patient agreeable and understanding of plan for admission.  ____________________________________________   FINAL CLINICAL IMPRESSION(S) / ED DIAGNOSES  Final diagnoses:  Bronchial spasms  Acute bronchitis, unspecified organism      NEW MEDICATIONS STARTED DURING THIS VISIT:  New Prescriptions   No medications on file     Note:  This document was prepared using Dragon voice recognition software and may include unintentional dictation errors.     Sharyn CreamerQuale, Veena Sturgess, MD 10/29/17 [phone]

## 2017-10-28 NOTE — H&P (Signed)
[facility] - [place] [facility]   PATIENT NAME: Kendra Lowe    MR#:  [id number]  DATE OF BIRTH:  04/19/1990  DATE OF ADMISSION:  10/28/2017  PRIMARY CARE PHYSICIAN: Patient, No Pcp Per   REQUESTING/REFERRING PHYSICIAN: Fanny BienQuale, MD  CHIEF COMPLAINT:  Shortness of breath  HISTORY OF PRESENT ILLNESS:  Kendra Lowe  is a 27 y.o. female who presents with shortness of breath and wheezing.  Patient states that this is been getting worse over the last 48 hours.  She does not have a history of childhood asthma, but does have a smoking history of about a pack per day for a little less than a decade.  She says that she has cut back on her smoking significantly over the last 7 months, after she started having recurring episodes like the one she has tonight with shortness of breath and wheezing.  She was hypoxic initially when she came to the ED tonight, and required BiPAP.  She was weaned off of BiPAP, but then became hypoxic again and required high flow nasal cannula.  Hospitalist were called for admission  PAST MEDICAL HISTORY:   Past Medical History:  Diagnosis Date  . Asthma   . Atypical squamous cell changes of undetermined significance (ASCUS) on cervical cytology with positive high risk human papilloma virus (HPV) 12/09/2015  . Medical history non-contributory      PAST SURGICAL HISTORY:   Past Surgical History:  Procedure Laterality Date  . CESAREAN SECTION N/A 06/20/2016   Procedure: CESAREAN SECTION;  Surgeon: Nadara Mustardobert P Harris, MD;  Location: [facility] ORS;  Service: Obstetrics;  Laterality: N/A;  . ORIF HUMERUS FRACTURE  04/03/2011   Procedure: OPEN REDUCTION INTERNAL FIXATION (ORIF) PROXIMAL HUMERUS FRACTURE;  Surgeon: Kathryne Hitchhristopher Y Blackman;  Location: [facility] OR;  Service: Orthopedics;  Laterality: Right;  . SHOULDER SURGERY Right 03/2011  . SHOULDER SURGERY     plate and pins in shoulder     SOCIAL HISTORY:   Social History   Tobacco Use  . Smoking  status: Current Every Day Smoker    Packs/day: 0.50  . Smokeless tobacco: Never Used  . Tobacco comment: recently quit  Substance Use Topics  . Alcohol use: No     FAMILY HISTORY:   Family History  Problem Relation Age of Onset  . Diabetes Paternal Aunt   . Lung cancer Paternal Aunt   . Breast cancer Paternal Grandfather      DRUG ALLERGIES:   Allergies  Allergen Reactions  . Nickel Rash    MEDICATIONS AT HOME:   Prior to Admission medications   Not on File    REVIEW OF SYSTEMS:  Review of Systems  Constitutional: Negative for chills, fever, malaise/fatigue and weight loss.  HENT: Negative for ear pain, hearing loss and tinnitus.   Eyes: Negative for blurred vision, double vision, pain and redness.  Respiratory: Positive for shortness of breath and wheezing. Negative for cough and hemoptysis.   Cardiovascular: Negative for chest pain, palpitations, orthopnea and leg swelling.  Gastrointestinal: Negative for abdominal pain, constipation, diarrhea, nausea and vomiting.  Genitourinary: Negative for dysuria, frequency and hematuria.  Musculoskeletal: Negative for back pain, joint pain and neck pain.  Skin:       No acne, rash, or lesions  Neurological: Negative for dizziness, tremors, focal weakness and weakness.  Endo/Heme/Allergies: Negative for polydipsia. Does not bruise/bleed easily.  Psychiatric/Behavioral: Negative for depression. The patient is not nervous/anxious and does not have insomnia.  VITAL SIGNS:   Vitals:   10/28/17 2100 10/28/17 2130 10/28/17 2145 10/28/17 2200  BP: 124/74 117/75  (!) 129/102  Pulse: (!) 132 (!) 136 (!) 133 (!) 135  Resp: (!) 25 (!) 23 (!) 24 (!) 22  Temp:      TempSrc:      SpO2: 97% 100% 99% 100%  Weight:      Height:       Wt Readings from Last 3 Encounters:  10/28/17 60.3 kg (133 lb)  06/19/16 87.1 kg (192 lb)  05/18/16 83.1 kg (183 lb 4.8 oz)    PHYSICAL EXAMINATION:  Physical Exam  Vitals  reviewed. Constitutional: She is oriented to person, place, and time. She appears well-developed and well-nourished. No distress.  HENT:  Head: Normocephalic and atraumatic.  Mouth/Throat: Oropharynx is clear and moist.  Eyes: Pupils are equal, round, and reactive to light. Conjunctivae and EOM are normal. No scleral icterus.  Neck: Normal range of motion. Neck supple. No JVD present. No thyromegaly present.  Cardiovascular: Normal rate, regular rhythm and intact distal pulses. Exam reveals no gallop and no friction rub.  No murmur heard. Respiratory: She is in respiratory distress. She has wheezes. She has no rales.  GI: Soft. Bowel sounds are normal. She exhibits no distension. There is no tenderness.  Musculoskeletal: Normal range of motion. She exhibits no edema.  No arthritis, no gout  Lymphadenopathy:    She has no cervical adenopathy.  Neurological: She is alert and oriented to person, place, and time. No cranial nerve deficit.  No dysarthria, no aphasia  Skin: Skin is warm and dry. No rash noted. No erythema.  Psychiatric: She has a normal mood and affect. Her behavior is normal. Judgment and thought content normal.    LABORATORY PANEL:   CBC Recent Labs  Lab 10/28/17 2027  WBC 22.5*  HGB 13.1  HCT 39.6  PLT 412   ------------------------------------------------------------------------------------------------------------------  Chemistries  Recent Labs  Lab 10/28/17 2027  NA 134*  K 3.4*  CL 92*  CO2 30  GLUCOSE 197*  BUN 6  CREATININE 0.58  CALCIUM 9.1   ------------------------------------------------------------------------------------------------------------------  Cardiac Enzymes No results for input(s): TROPONINI in the last 168 hours. ------------------------------------------------------------------------------------------------------------------  RADIOLOGY:  Dg Chest Port 1 View  Result Date: 10/28/2017 CLINICAL DATA:  Shortness of breath  worsening over the last few days; pt says since January she's had several bouts with pneumonia and bronchitis EXAM: PORTABLE CHEST 1 VIEW COMPARISON:  CT, 06/30/2014 FINDINGS: Cardiac silhouette is normal size and configuration. Normal mediastinal and hilar contours. Lungs are clear.  No pleural effusion or pneumothorax. Status post ORIF of a right proximal humerus fracture. Skeletal structures otherwise unremarkable. IMPRESSION: No active disease. Electronically Signed   By: Amie Portland M.D.   On: 10/28/2017 20:58    EKG:   Orders placed or performed during the hospital encounter of 10/28/17  . ED EKG  . ED EKG  . EKG 12-Lead  . EKG 12-Lead    IMPRESSION AND PLAN:  Principal Problem:   Asthma exacerbation -versus early COPD versus chronic bronchitis from smoking.  Patient does not have childhood history of asthma.  Chest x-ray does not show pneumonia, though she does have an elevated white blood cell count.  Will use IV Solu-Medrol, azithromycin, PRN duo nebs and other supportive treatment. Active Problems:   Hyperglycemia -patient has a history of gestational diabetes, she comes in tonight with elevated blood glucose levels.  Will use sliding scale insulin with appropriate glucose  checks, will also check a hemoglobin A1c.  Chart review performed and case discussed with ED provider. Labs, imaging and/or ECG reviewed by provider and discussed with patient/family. Management plans discussed with the patient and/or family.  DVT PROPHYLAXIS: SubQ lovenox  GI PROPHYLAXIS: None  ADMISSION STATUS: Inpatient  CODE STATUS: Full Code Status History    Date Active Date Inactive Code Status Order ID Comments User Context   06/17/2016 2107 06/20/2016 0856 Full Code [id number]  Conard Novak, MD Inpatient   04/03/2011 1246 04/04/2011 1028 Full Code [id number]  Hilarie Fredrickson, RN Inpatient      TOTAL TIME TAKING CARE OF THIS PATIENT: 45 minutes.   Jontez Redfield FIELDING 10/28/2017, 10:29  PM  [facility]  Office  [phone]  CC: Primary care physician; Patient, No Pcp Per  Note:  This document was prepared using Dragon voice recognition software and may include unintentional dictation errors.

## 2017-10-28 NOTE — ED Notes (Signed)
Pt tolerating bipap well. Pt able to speak in full sentences over mask.

## 2017-10-28 NOTE — ED Notes (Signed)
Pt arrived to room unable to speak in full sentences, ra pox of 60%, pt placed on non rebreather, skin dusky. Pt with improvement to 91% on non rebreather. Pt placed on bipap by rt with improvement to 100% and improvement in work of breathing and skin color. Skin color now pwd, hr sinus tachycardia rate of 126. Pt tolerating bipap well.

## 2017-10-28 NOTE — ED Notes (Signed)
Pt assisted up to commode to void by ed tech lydia.

## 2017-10-29 ENCOUNTER — Other Ambulatory Visit: Payer: Self-pay

## 2017-10-29 DIAGNOSIS — J45901 Unspecified asthma with (acute) exacerbation: Secondary | ICD-10-CM

## 2017-10-29 DIAGNOSIS — R739 Hyperglycemia, unspecified: Secondary | ICD-10-CM

## 2017-10-29 DIAGNOSIS — J9601 Acute respiratory failure with hypoxia: Secondary | ICD-10-CM

## 2017-10-29 DIAGNOSIS — F17200 Nicotine dependence, unspecified, uncomplicated: Secondary | ICD-10-CM

## 2017-10-29 LAB — CBC
HCT: 34.3 % — ABNORMAL LOW (ref 35.0–47.0)
Hemoglobin: 11.4 g/dL — ABNORMAL LOW (ref 12.0–16.0)
MCH: 27.6 pg (ref 26.0–34.0)
MCHC: 33.2 g/dL (ref 32.0–36.0)
MCV: 83.1 fL (ref 80.0–100.0)
PLATELETS: 295 10*3/uL (ref 150–440)
RBC: 4.13 MIL/uL (ref 3.80–5.20)
RDW: 15.2 % — ABNORMAL HIGH (ref 11.5–14.5)
WBC: 16.7 10*3/uL — ABNORMAL HIGH (ref 3.6–11.0)

## 2017-10-29 LAB — BASIC METABOLIC PANEL
Anion gap: 13 (ref 5–15)
Anion gap: 8 (ref 5–15)
BUN: 6 mg/dL (ref 6–20)
BUN: 9 mg/dL (ref 6–20)
CALCIUM: 8.6 mg/dL — AB (ref 8.9–10.3)
CALCIUM: 9.1 mg/dL (ref 8.9–10.3)
CO2: 28 mmol/L (ref 22–32)
CO2: 28 mmol/L (ref 22–32)
CREATININE: 0.56 mg/dL (ref 0.44–1.00)
Chloride: 96 mmol/L — ABNORMAL LOW (ref 98–111)
Chloride: 101 mmol/L (ref 98–111)
Creatinine, Ser: 0.84 mg/dL (ref 0.44–1.00)
GFR calc non Af Amer: >60 mL/min (ref >60)
GLUCOSE: 367 mg/dL — AB (ref 70–99)
GLUCOSE: 211 mg/dL — AB (ref 70–99)
Potassium: 3.2 mmol/L — ABNORMAL LOW (ref 3.5–5.1)
Potassium: 4.3 mmol/L (ref 3.5–5.1)
Sodium: 137 mmol/L (ref 135–145)
Sodium: 137 mmol/L (ref 135–145)

## 2017-10-29 LAB — GLUCOSE, CAPILLARY
GLUCOSE-CAPILLARY: 190 mg/dL — AB (ref 70–99)
GLUCOSE-CAPILLARY: 355 mg/dL — AB (ref 70–99)
Glucose-Capillary: 287 mg/dL — ABNORMAL HIGH (ref 70–99)
Glucose-Capillary: 300 mg/dL — ABNORMAL HIGH (ref 70–99)
Glucose-Capillary: 209 mg/dL — ABNORMAL HIGH (ref 70–99)

## 2017-10-29 LAB — HEMOGLOBIN A1C
Hgb A1c MFr Bld: 6.6 % — ABNORMAL HIGH (ref 4.8–5.6)
Mean Plasma Glucose: 142.72 mg/dL

## 2017-10-29 LAB — BRAIN NATRIURETIC PEPTIDE: B Natriuretic Peptide: 149 pg/mL — ABNORMAL HIGH (ref 0.0–100.0)

## 2017-10-29 LAB — MRSA PCR SCREENING: MRSA BY PCR: NEGATIVE

## 2017-10-29 MED ORDER — GUAIFENESIN-CODEINE 100-10 MG/5ML PO SOLN
10.0000 mL | ORAL | Status: DC | PRN
  Administered 2017-10-29: 20:00:00 10 mL via ORAL
  Filled 2017-10-29: qty 10

## 2017-10-29 MED ORDER — INSULIN ASPART 100 UNIT/ML ~~LOC~~ SOLN
0.0000 [IU] | Freq: Three times a day (TID) | SUBCUTANEOUS | Status: DC
  Administered 2017-10-29 (×2): 5 [IU] via SUBCUTANEOUS
  Administered 2017-10-29 – 2017-10-30 (×3): 2 [IU] via SUBCUTANEOUS
  Filled 2017-10-29 (×5): qty 1

## 2017-10-29 MED ORDER — ONDANSETRON HCL 4 MG/2ML IJ SOLN: 4.0000 mg | Freq: Four times a day (QID) | INTRAMUSCULAR | Status: DC | PRN

## 2017-10-29 MED ORDER — LEVOFLOXACIN IN D5W 750 MG/150ML IV SOLN: 750.0000 mg | INTRAVENOUS | Status: DC

## 2017-10-29 MED ORDER — NICOTINE 21 MG/24HR TD PT24
21.0000 mg | MEDICATED_PATCH | Freq: Every day | TRANSDERMAL | Status: DC
  Administered 2017-10-29: 21 mg via TRANSDERMAL
  Filled 2017-10-29: qty 1

## 2017-10-29 MED ORDER — ENOXAPARIN SODIUM 40 MG/0.4ML ~~LOC~~ SOLN
40.0000 mg | SUBCUTANEOUS | Status: DC
  Administered 2017-10-29: 40 mg via SUBCUTANEOUS
  Filled 2017-10-29: qty 0.4

## 2017-10-29 MED ORDER — PNEUMOCOCCAL VAC POLYVALENT 25 MCG/0.5ML IJ INJ
0.5000 mL | INJECTION | INTRAMUSCULAR | Status: AC
  Administered 2017-10-30: 0.5 mL via INTRAMUSCULAR
  Filled 2017-10-29 (×2): qty 0.5

## 2017-10-29 MED ORDER — ACETAMINOPHEN 325 MG PO TABS: 650.0000 mg | ORAL_TABLET | Freq: Four times a day (QID) | ORAL | Status: DC | PRN

## 2017-10-29 MED ORDER — DOXYCYCLINE HYCLATE 100 MG PO TABS
100.0000 mg | ORAL_TABLET | Freq: Two times a day (BID) | ORAL | Status: DC
  Administered 2017-10-29 – 2017-10-30 (×3): 100 mg via ORAL
  Filled 2017-10-29 (×4): qty 1

## 2017-10-29 MED ORDER — ONDANSETRON HCL 4 MG PO TABS: 4.0000 mg | ORAL_TABLET | Freq: Four times a day (QID) | ORAL | Status: DC | PRN

## 2017-10-29 MED ORDER — HYDROCOD POLST-CPM POLST ER 10-8 MG/5ML PO SUER
5.0000 mL | Freq: Two times a day (BID) | ORAL | Status: DC | PRN
  Administered 2017-10-29: 5 mL via ORAL
  Filled 2017-10-29: qty 5

## 2017-10-29 MED ORDER — METHYLPREDNISOLONE SODIUM SUCC 40 MG IJ SOLR
40.0000 mg | Freq: Two times a day (BID) | INTRAMUSCULAR | Status: DC
  Administered 2017-10-29 – 2017-10-30 (×2): 40 mg via INTRAVENOUS
  Filled 2017-10-29 (×2): qty 1

## 2017-10-29 MED ORDER — FLUTICASONE PROPIONATE 50 MCG/ACT NA SUSP
2.0000 | Freq: Every day | NASAL | Status: DC
  Administered 2017-10-29 – 2017-10-30 (×2): 2 via NASAL
  Filled 2017-10-29: qty 16

## 2017-10-29 MED ORDER — POTASSIUM CHLORIDE 20 MEQ PO PACK
60.0000 meq | PACK | Freq: Once | ORAL | Status: AC
Start: 2017-10-29 — End: 2017-10-29
  Administered 2017-10-29: 60 meq via ORAL
  Filled 2017-10-29: qty 3

## 2017-10-29 MED ORDER — LEVOFLOXACIN IN D5W 750 MG/150ML IV SOLN
750.0000 mg | INTRAVENOUS | Status: DC
  Filled 2017-10-29: qty 150

## 2017-10-29 MED ORDER — ACETAMINOPHEN 650 MG RE SUPP: 650.0000 mg | Freq: Four times a day (QID) | RECTAL | Status: DC | PRN

## 2017-10-29 MED ORDER — LEVALBUTEROL HCL 1.25 MG/0.5ML IN NEBU
1.2500 mg | INHALATION_SOLUTION | Freq: Four times a day (QID) | RESPIRATORY_TRACT | Status: DC | PRN
  Administered 2017-10-29: 1.25 mg via RESPIRATORY_TRACT
  Filled 2017-10-29: qty 0.5

## 2017-10-29 MED ORDER — ALBUTEROL SULFATE (2.5 MG/3ML) 0.083% IN NEBU: 2.5000 mg | INHALATION_SOLUTION | RESPIRATORY_TRACT | Status: DC | PRN

## 2017-10-29 MED ORDER — METHYLPREDNISOLONE SODIUM SUCC 125 MG IJ SOLR
60.0000 mg | Freq: Four times a day (QID) | INTRAMUSCULAR | Status: DC
  Administered 2017-10-29 (×2): 60 mg via INTRAVENOUS
  Filled 2017-10-29 (×2): qty 2

## 2017-10-29 MED ORDER — IPRATROPIUM-ALBUTEROL 0.5-2.5 (3) MG/3ML IN SOLN
3.0000 mL | Freq: Four times a day (QID) | RESPIRATORY_TRACT | Status: DC
  Administered 2017-10-29 (×2): 3 mL via RESPIRATORY_TRACT
  Filled 2017-10-29 (×4): qty 3

## 2017-10-29 MED ORDER — SODIUM CHLORIDE 0.9 % IV SOLN
INTRAVENOUS | Status: DC
  Administered 2017-10-29: 02:00:00 via INTRAVENOUS

## 2017-10-29 MED ORDER — INSULIN ASPART 100 UNIT/ML ~~LOC~~ SOLN
0.0000 [IU] | Freq: Every day | SUBCUTANEOUS | Status: DC
  Administered 2017-10-29: 20:00:00 2 [IU] via SUBCUTANEOUS
  Administered 2017-10-29: 5 [IU] via SUBCUTANEOUS
  Filled 2017-10-29 (×2): qty 1

## 2017-10-29 MED ORDER — BUDESONIDE 0.5 MG/2ML IN SUSP
0.5000 mg | Freq: Two times a day (BID) | RESPIRATORY_TRACT | Status: DC
  Administered 2017-10-29 (×2): 0.5 mg via RESPIRATORY_TRACT
  Filled 2017-10-29 (×3): qty 2

## 2017-10-29 NOTE — Progress Notes (Signed)
Report given to [place] RockRobyn, Charity fundraiserN . Patient being transferred to 1C room 129

## 2017-10-29 NOTE — Plan of Care (Signed)
  Problem: Education: Goal: Knowledge of General Education information will improve Outcome: Progressing   Problem: Health Behavior/Discharge Planning: Goal: Ability to manage health-related needs will improve Outcome: Progressing   Problem: Clinical Measurements: Goal: Ability to maintain clinical measurements within normal limits will improve Outcome: Progressing Goal: Cardiovascular complication will be avoided Outcome: Progressing   Problem: Pain Managment: Goal: General experience of comfort will improve Outcome: Progressing   Problem: Safety: Goal: Ability to remain free from injury will improve Outcome: Progressing   Problem: Skin Integrity: Goal: Risk for impaired skin integrity will decrease Outcome: Progressing   

## 2017-10-29 NOTE — Progress Notes (Signed)
[employer] - [place] at [facility]                                                                                                                                                                                  Patient Demographics   Kendra Lowe, is a 27 y.o. female, DOB - 1991-04-03, WJX:[id number]  Admit date - 10/28/2017   Admitting Physician Oralia Manis, MD  Outpatient Primary MD for the patient is Patient, No Pcp Per   LOS - 1  Subjective: Patient admitted with shortness of breath Shortness of breath improved  Still coughing lot   Review of Systems:   CONSTITUTIONAL: No documented fever. No fatigue, weakness. No weight gain, no weight loss.  EYES: No blurry or double vision.  ENT: No tinnitus. No postnasal drip. No redness of the oropharynx.  RESPIRATORY: Positive cough, positive wheeze, no hemoptysis. No dyspnea.  CARDIOVASCULAR: No chest pain. No orthopnea. No palpitations. No syncope.  GASTROINTESTINAL: No nausea, no vomiting or diarrhea. No abdominal pain. No melena or hematochezia.  GENITOURINARY: No dysuria or hematuria.  ENDOCRINE: No polyuria or nocturia. No heat or cold intolerance.  HEMATOLOGY: No anemia. No bruising. No bleeding.  INTEGUMENTARY: No rashes. No lesions.  MUSCULOSKELETAL: No arthritis. No swelling. No gout.  NEUROLOGIC: No numbness, tingling, or ataxia. No seizure-type activity.  PSYCHIATRIC: No anxiety. No insomnia. No ADD.    Vitals:   Vitals:   10/29/17 0800 10/29/17 0826 10/29/17 1000 10/29/17 1049  BP: (!) 90/50  105/87 118/72  Pulse: 78  100 (!) 101  Resp: 19  20 20   Temp: 97.6 F (36.4 C)   98.5 F (36.9 C)  TempSrc: Oral   Oral  SpO2: 98% 96% 94% 94%  Weight:    62.3 kg (137 lb 6.4 oz)  Height:    5\' 6"  (1.676 m)    Wt Readings from Last 3 Encounters:  10/29/17 62.3 kg (137 lb 6.4 oz)  06/19/16 87.1 kg (192 lb)  05/18/16 83.1 kg (183 lb 4.8 oz)     Intake/Output Summary (Last 24 hours) at 10/29/2017  1213 Last data filed at 10/29/2017 0813 Gross per 24 hour  Intake 1284.16 ml  Output 425 ml  Net 859.16 ml    Physical Exam:   GENERAL: Pleasant-appearing in no apparent distress.  HEAD, EYES, EARS, NOSE AND THROAT: Atraumatic, normocephalic. Extraocular muscles are intact. Pupils equal and reactive to light. Sclerae anicteric. No conjunctival injection. No oro-pharyngeal erythema.  NECK: Supple. There is no jugular venous distention. No bruits, no lymphadenopathy, no thyromegaly.  HEART: Regular rate and rhythm,. No murmurs, no rubs, no clicks.  LUNGS:  bilateral wheezing throughout both lungs. No  rales or rhonchi. No wheezes.  ABDOMEN: Soft, flat, nontender, nondistended. Has good bowel sounds. No hepatosplenomegaly appreciated.  EXTREMITIES: No evidence of any cyanosis, clubbing, or peripheral edema.  +2 pedal and radial pulses bilaterally.  NEUROLOGIC: The patient is alert, awake, and oriented x3 with no focal motor or sensory deficits appreciated bilaterally.  SKIN: Moist and warm with no rashes appreciated.  Psych: Not anxious, depressed LN: No inguinal LN enlargement    Antibiotics   Anti-infectives (From admission, onward)   Start     Dose/Rate Route Frequency Ordered Stop   10/29/17 2200  levofloxacin (LEVAQUIN) IVPB 750 mg  Status:  Discontinued     750 mg 100 mL/hr over 90 Minutes Intravenous Every 24 hours 10/29/17 0209 10/29/17 0636   10/29/17 2200  levofloxacin (LEVAQUIN) IVPB 750 mg  Status:  Discontinued     750 mg 100 mL/hr over 90 Minutes Intravenous Every 24 hours 10/29/17 0636 10/29/17 0932   10/29/17 1000  doxycycline (VIBRA-TABS) tablet 100 mg     100 mg Oral Every 12 hours 10/29/17 0932 11/03/17 0959   10/28/17 2130  cefTRIAXone (ROCEPHIN) 2 g in sodium chloride 0.9 % 100 mL IVPB     2 g 200 mL/hr over 30 Minutes Intravenous  Once 10/28/17 2125 10/28/17 2227   10/28/17 2130  azithromycin (ZITHROMAX) 500 mg in sodium chloride 0.9 % 250 mL IVPB  Status:   Discontinued     500 mg 250 mL/hr over 60 Minutes Intravenous Every 24 hours 10/28/17 2125 10/29/17 0209   10/28/17 2119  sodium chloride 0.9 % with azithromycin [facility]) ADS Med    Note to Pharmacy:  Brumgard, April   : cabinet override      10/28/17 2119 10/29/17 0929   10/28/17 2118  cefTRIAXone (ROCEPHIN) 1 g injection    Note to Pharmacy:  Brumgard, April   : cabinet override      10/28/17 2118 10/29/17 0929      Medications   Scheduled Meds: . budesonide (PULMICORT) nebulizer solution  0.5 mg Nebulization BID  . doxycycline  100 mg Oral Q12H  . enoxaparin (LOVENOX) injection  40 mg Subcutaneous Q24H  . insulin aspart  0-5 Units Subcutaneous QHS  . insulin aspart  0-9 Units Subcutaneous TID WC  . ipratropium-albuterol  3 mL Nebulization Q6H  . methylPREDNISolone (SOLU-MEDROL) injection  40 mg Intravenous Q12H  . [START ON 10/30/2017] pneumococcal 23 valent vaccine  0.5 mL Intramuscular Tomorrow-1000   Continuous Infusions: PRN Meds:.acetaminophen **OR** acetaminophen, albuterol, chlorpheniramine-HYDROcodone, [DISCONTINUED] ondansetron **OR** ondansetron (ZOFRAN) IV   Data Review:   Micro Results Recent Results (from the past 240 hour(s))  Blood Culture (routine x 2)     Status: None (Preliminary result)   Collection Time: 10/28/17  9:28 PM  Result Value Ref Range Status   Specimen Description BLOOD LEFT ANTECUBITAL  Final   Special Requests   Final    BOTTLES DRAWN AEROBIC AND ANAEROBIC Blood Culture adequate volume   Culture   Final    NO GROWTH < 12 HOURS Performed at [facility], [address] [place], [place] [address]    Report Status PENDING  Incomplete  Blood Culture (routine x 2)     Status: None (Preliminary result)   Collection Time: 10/28/17  9:28 PM  Result Value Ref Range Status   Specimen Description BLOOD RIGHT FOREARM  Final   Special Requests   Final    BOTTLES DRAWN AEROBIC AND ANAEROBIC Blood Culture adequate volume  Culture    Final    NO GROWTH < 12 HOURS Performed at [facility], [address] [place], [place] [address]    Report Status PENDING  Incomplete  MRSA PCR Screening     Status: None   Collection Time: 10/29/17 12:31 AM  Result Value Ref Range Status   MRSA by PCR NEGATIVE NEGATIVE Final    Comment:        The GeneXpert MRSA Assay (FDA approved for NASAL specimens only), is one component of a comprehensive MRSA colonization surveillance program. It is not intended to diagnose MRSA infection nor to guide or monitor treatment for MRSA infections. Performed at [facility], [address] [place], [place] [address]     Radiology Reports Dg Chest CrestviewPort 1 View  Result Date: 10/28/2017 CLINICAL DATA:  Shortness of breath worsening over the last few days; pt says since January she's had several bouts with pneumonia and bronchitis EXAM: PORTABLE CHEST 1 VIEW COMPARISON:  CT, 06/30/2014 FINDINGS: Cardiac silhouette is normal size and configuration. Normal mediastinal and hilar contours. Lungs are clear.  No pleural effusion or pneumothorax. Status post ORIF of a right proximal humerus fracture. Skeletal structures otherwise unremarkable. IMPRESSION: No active disease. Electronically Signed   By: Amie Portlandavid  Ormond M.D.   On: 10/28/2017 20:58     CBC Recent Labs  Lab 10/28/17 2027 10/29/17 0218  WBC 22.5* 16.7*  HGB 13.1 11.4*  HCT 39.6 34.3*  PLT 412 295  MCV 83.0 83.1  MCH 27.4 27.6  MCHC 33.0 33.2  RDW 15.3* 15.2*    Chemistries  Recent Labs  Lab 10/28/17 2027 10/29/17 0218  NA 134* 137  K 3.4* 3.2*  CL 92* 96*  CO2 30 28  GLUCOSE 197* 367*  BUN 6 6  CREATININE 0.58 0.84  CALCIUM 9.1 8.6*   ------------------------------------------------------------------------------------------------------------------ estimated creatinine clearance is 93.3 mL/min (by C-G formula based on SCr of 0.84  mg/dL). ------------------------------------------------------------------------------------------------------------------ Recent Labs    10/28/17 2027  HGBA1C 6.6*   ------------------------------------------------------------------------------------------------------------------ No results for input(s): CHOL, HDL, LDLCALC, TRIG, CHOLHDL, LDLDIRECT in the last 72 hours. ------------------------------------------------------------------------------------------------------------------ No results for input(s): TSH, T4TOTAL, T3FREE, THYROIDAB in the last 72 hours.  Invalid input(s): FREET3 ------------------------------------------------------------------------------------------------------------------ No results for input(s): VITAMINB12, FOLATE, FERRITIN, TIBC, IRON, RETICCTPCT in the last 72 hours.  Coagulation profile No results for input(s): INR, PROTIME in the last 168 hours.  No results for input(s): DDIMER in the last 72 hours.  Cardiac Enzymes No results for input(s): CKMB, TROPONINI, MYOGLOBIN in the last 168 hours.  Invalid input(s): CK ------------------------------------------------------------------------------------------------------------------ Invalid input(s): POCBNP    Assessment & Plan  Patient is a 27 year old presenting with shortness of breath no history diagnosed history of asthma  1.  Acute respiratory failure Likely due to asthma exacerbation -versus early COPD versus chronic bronchitis from smoking.   Continue nebs, solumedrol , new antitussive medication Continue doxycycline for possible bronchitis   2.  Hyperglycemia -hemoglobin A1c of 6.6 that is borderline diabetes patient needs diet control  3.  Nicotine abuse smoking cessation provided 4 minutes spent strongly recommend she stop smoking patient has a nicotine patch      Code Status Orders  (From admission, onward)        Start     Ordered   10/29/17 0022  Full code  Continuous      10/29/17 0021    Code Status History    Date Active Date Inactive Code Status Order ID Comments User Context  06/17/2016 2107 06/20/2016 0856 Full Code [id number]  Conard Novak, MD Inpatient   04/03/2011 1246 04/04/2011 1028 Full Code [id number]  Hilarie Fredrickson, RN Inpatient           Consults  intesvist  DVT Prophylaxis  Lovenox    Lab Results  Component Value Date   PLT 295 10/29/2017     Time Spent in minutes   Greater than 50% of time spent in care coordination and counseling patient regarding the condition and plan of care.   Auburn Bilberry M.D on 10/29/2017 at 12:13 PM  Between 7am to 6pm - Pager - [phone]  After 6pm go to [email or web address] - Social research officer, government  [employer]   Office  [phone]

## 2017-10-29 NOTE — Consult Note (Addendum)
PULMONARY / CRITICAL CARE MEDICINE   Name: Kendra Lowe MRN: [id number] DOB: 29-Jan-1991    ADMISSION DATE:  10/28/2017   CONSULTATION DATE: 10/29/20  REFERRING MD: Dr. Anne Hahn  Reason: Acute respiratory failure  HISTORY OF PRESENT ILLNESS:   This is a 27 year old female, current everyday smoker, and history of pneumonia with recent ED visit and treatment for pneumonia who presented to the ED with worsening shortness of breath and wheezing.  Patient states that it all started in January when she developed shortness of breath and was found to have acute bronchitis and was treated.  Post discharge she never felt better and in April she was diagnosed with pneumonia and bronchitis again.  She was treated and discharged home on inhalers however she was not able to afford them.  She presented to the ED with SPO2 of 64% and was placed on BiPAP.  Her chest x-ray was unremarkable.  She is being admitted to the ICU for further management acute COPD exacerbation She continues to smoke and expresses the desire to quit She denies chest pain palpitations nausea vomiting diarrhea dizziness  PAST MEDICAL HISTORY :  She  has a past medical history of Asthma, Atypical squamous cell changes of undetermined significance (ASCUS) on cervical cytology with positive high risk human papilloma virus (HPV) (12/09/2015), and Medical history non-contributory.  PAST SURGICAL HISTORY: She  has a past surgical history that includes ORIF humerus fracture (04/03/2011); Shoulder surgery (Right, 03/2011); Shoulder surgery; and Cesarean section (N/A, 06/20/2016).  Allergies  Allergen Reactions  . Nickel Rash    No current facility-administered medications on file prior to encounter.    No current outpatient medications on file prior to encounter.    FAMILY HISTORY:  Her family history includes Breast cancer in her paternal grandfather; Diabetes in her paternal aunt; Lung cancer in her paternal aunt.  SOCIAL  HISTORY: She  reports that she has been smoking.  She has been smoking about 0.50 packs per day. She has never used smokeless tobacco. She reports that she does not drink alcohol or use drugs.  REVIEW OF SYSTEMS:   Constitutional: Negative for fever and chills.  HENT: Negative for congestion and rhinorrhea.  Eyes: Negative for redness and visual disturbance.  Respiratory: Positive for shortness of breath and wheezing.  Cardiovascular: Negative for chest pain and palpitations.  Gastrointestinal: Negative  for nausea , vomiting and abdominal pain and  Loose stools Genitourinary: Negative for dysuria and urgency.  Endocrine: Denies polyuria, polyphagia and heat intolerance Musculoskeletal: Negative for myalgias and arthralgias.  Skin: Negative for pallor and wound.  Neurological: Negative for dizziness and headaches   SUBJECTIVE:   VITAL SIGNS: BP 122/76   Pulse (!) 113   Temp 98.5 F (36.9 C) (Oral)   Resp (!) 27   Ht 5' 2.5" (1.588 m)   Wt 132 lb 15 oz (60.3 kg)   LMP 10/18/2017 (Approximate)   SpO2 100%   Breastfeeding? No   BMI 23.93 kg/m   HEMODYNAMICS:    VENTILATOR SETTINGS: FiO2 (%):  [40 %] 40 %  INTAKE / OUTPUT: No intake/output data recorded.  PHYSICAL EXAMINATION: General: Moderate respiratory distress Neuro: Cranial nerves intact HEENT: PERRLA, trachea midline, no JVD, no thyromegaly Cardiovascular: Apical pulse regular, S1-S2, no murmur regurg or gallop, +2 pulses bilaterally, no edema Lungs: Breath sounds diminished in all lung fields, expiratory wheezes in right lung fields, no rhonchi Abdomen: Nondistended, normal bowel sounds in all 4 quadrants Musculoskeletal: No joint deformities, positive range of motion  Skin: Warm and dry  LABS:  BMET Recent Labs  Lab 10/28/17 2027 10/29/17 0218  NA 134* 137  K 3.4* 3.2*  CL 92* 96*  CO2 30 28  BUN 6 6  CREATININE 0.58 0.84  GLUCOSE 197* 367*    Electrolytes Recent Labs  Lab 10/28/17 2027  10/29/17 0218  CALCIUM 9.1 8.6*    CBC Recent Labs  Lab 10/28/17 2027 10/29/17 0218  WBC 22.5* 16.7*  HGB 13.1 11.4*  HCT 39.6 34.3*  PLT 412 295    Coag's No results for input(s): APTT, INR in the last 168 hours.  Sepsis Markers Recent Labs  Lab 10/28/17 2128  LATICACIDVEN 1.5    ABG No results for input(s): PHART, PCO2ART, PO2ART in the last 168 hours.  Liver Enzymes No results for input(s): AST, ALT, ALKPHOS, BILITOT, ALBUMIN in the last 168 hours.  Cardiac Enzymes No results for input(s): TROPONINI, PROBNP in the last 168 hours.  Glucose Recent Labs  Lab 10/29/17 0020  GLUCAP 355*    Imaging Dg Chest Port 1 View  Result Date: 10/28/2017 CLINICAL DATA:  Shortness of breath worsening over the last few days; pt says since January she's had several bouts with pneumonia and bronchitis EXAM: PORTABLE CHEST 1 VIEW COMPARISON:  CT, 06/30/2014 FINDINGS: Cardiac silhouette is normal size and configuration. Normal mediastinal and hilar contours. Lungs are clear.  No pleural effusion or pneumothorax. Status post ORIF of a right proximal humerus fracture. Skeletal structures otherwise unremarkable. IMPRESSION: No active disease. Electronically Signed   By: Amie Portlandavid  Ormond M.D.   On: 10/28/2017 20:58    STUDIES:  2D echo pending  CULTURES: Blood cultures x2  ANTIBIOTICS: Azithromycin ceftriaxone given in the ED We will change to Levaquin 750 daily for 7 days  SIGNIFICANT EVENTS: 10/28/2017: Admitted  LINES/TUBES: Peripheral IV  DISCUSSION: 27 year old female presenting with acute COPD exacerbation due to medication non adherence  ASSESSMENT Acute hypoxic and hypercarbic respiratory failure Tobacco use disorder Acute on chronic COPD exacerbation with superimposed bronchospasms Hypokalemia Leukocytosis   PLAN Continues BiPAP and titrate to nasal cannula as tolerated Nebulized bronchodilators IV steroids Levaquin as above Follow-up cultures Nicotine  patch Monitor and replace electrolytes Trend WBC, procac and repeat cultures of febrile Care management referral for medication assistance GI and DVT prophylaxis  FAMILY  - Updates: Patient updated on current treatment plan  Emilyanne Mcgough S. [facility] ANP-BC Pulmonary and Critical Care Medicine [facility] Pager [phone] or [phone]  NB: This document was prepared using Dragon voice recognition software and may include unintentional dictation errors.   10/29/2017, 5:23 AM

## 2017-10-30 ENCOUNTER — Telehealth: Payer: Self-pay | Admitting: Pulmonary Disease

## 2017-10-30 LAB — GLUCOSE, CAPILLARY
GLUCOSE-CAPILLARY: 172 mg/dL — AB (ref 70–99)
Glucose-Capillary: 155 mg/dL — ABNORMAL HIGH (ref 70–99)

## 2017-10-30 MED ORDER — IPRATROPIUM-ALBUTEROL 20-100 MCG/ACT IN AERS: 1.0000 | INHALATION_SPRAY | Freq: Four times a day (QID) | RESPIRATORY_TRACT | 2 refills | Status: DC

## 2017-10-30 MED ORDER — GUAIFENESIN-CODEINE 100-10 MG/5ML PO SOLN: 10.0000 mL | ORAL | 0 refills | Status: AC | PRN

## 2017-10-30 MED ORDER — IPRATROPIUM-ALBUTEROL 0.5-2.5 (3) MG/3ML IN SOLN: 3.0000 mL | Freq: Four times a day (QID) | RESPIRATORY_TRACT | 30 refills | Status: DC

## 2017-10-30 MED ORDER — IPRATROPIUM-ALBUTEROL 0.5-2.5 (3) MG/3ML IN SOLN: 3.0000 mL | RESPIRATORY_TRACT | Status: DC

## 2017-10-30 MED ORDER — DOXYCYCLINE HYCLATE 100 MG PO TABS: 100.0000 mg | ORAL_TABLET | Freq: Two times a day (BID) | ORAL | 0 refills | Status: AC

## 2017-10-30 MED ORDER — PREDNISONE 10 MG (21) PO TBPK: ORAL_TABLET | ORAL | 0 refills | Status: DC

## 2017-10-30 MED ORDER — FLUTICASONE PROPIONATE 50 MCG/ACT NA SUSP: 2.0000 | Freq: Every day | NASAL | 2 refills | Status: DC

## 2017-10-30 MED ORDER — MOMETASONE FURO-FORMOTEROL FUM 200-5 MCG/ACT IN AERO: 2.0000 | INHALATION_SPRAY | Freq: Two times a day (BID) | RESPIRATORY_TRACT | 0 refills | Status: DC

## 2017-10-30 NOTE — Discharge Summary (Addendum)
[facility] [place] [facility], [place] y.o., DOB June 22, 1990, MRN [id number]. Admission date: 10/28/2017 Discharge Date 10/30/2017 Primary MD Patient, No Pcp Per Admitting Physician Oralia Manis, MD  Admission Diagnosis  SOB (shortness of breath) [R06.02] Bronchial spasms [J98.01] Acute bronchitis, unspecified organism [J20.9] Asthma exacerbation [J45.901]  Discharge Diagnosis   Principal Problem: Acute bronchospasm possibly due to asthma versus early COPD Acute bronchitis Hyperglycemia due to glucose intolerance         Hospital CourseAmber Lowe  is a 27 y.o. female who presents with shortness of breath and wheezing.  Patient states that this is been getting worse over the last 48 hours.  She does not have a history of childhood asthma and nicotine abuse.  Patient presented with shortness of breath and cough.  Patient initially needed BiPAP.  Had to be weaned off BiPAP.  She was monitored in the ICU.  Now we are able to wean her oxygen off.  Chest x-ray was negative.  She does have a history of nicotine abuse.  There was some concern that she may have early COPD versus asthma exasperation.  She was treated for this.  Doing much better.  She will need outpatient pulmonary follow-up. Patient also noticed to have  Hyperglycemia hemoglobin A1c was 6.6.  Patient recommended to watch her sugar intake.  Strongly recommended to stop smoking.           Consults  None  Significant Tests:  See full reports for all details     Dg Chest Port 1 View  Result Date: 10/28/2017 CLINICAL DATA:  Shortness of breath worsening over the last few days; pt says since January she's had several bouts with pneumonia and bronchitis EXAM: PORTABLE CHEST 1 VIEW COMPARISON:  CT, 06/30/2014 FINDINGS: Cardiac silhouette is normal size and configuration. Normal mediastinal and hilar contours. Lungs are clear.  No pleural effusion or pneumothorax. Status post ORIF of  a right proximal humerus fracture. Skeletal structures otherwise unremarkable. IMPRESSION: No active disease. Electronically Signed   By: Amie Portland M.D.   On: 10/28/2017 20:58       Today   Subjective:   Kendra Lowe patient doing much better breathing much improved Objective:   Blood pressure 114/67, pulse 78, temperature 98.1 F (36.7 C), temperature source Oral, resp. rate 20, height 5\' 6"  (1.676 m), weight 62.3 kg (137 lb 6.4 oz), last menstrual period 10/18/2017, SpO2 93 %, not currently breastfeeding.  .  Intake/Output Summary (Last 24 hours) at 10/30/2017 1327 Last data filed at 10/29/2017 2017 Gross per 24 hour  Intake 300 ml  Output -  Net 300 ml    Exam VITAL SIGNS: Blood pressure 114/67, pulse 78, temperature 98.1 F (36.7 C), temperature source Oral, resp. rate 20, height 5\' 6"  (1.676 m), weight 62.3 kg (137 lb 6.4 oz), last menstrual period 10/18/2017, SpO2 93 %, not currently breastfeeding.  GENERAL:  27 y.o.-year-old patient lying in the bed with no acute distress.  EYES: Pupils equal, round, reactive to light and accommodation. No scleral icterus. Extraocular muscles intact.  HEENT: Head atraumatic, normocephalic. Oropharynx and nasopharynx clear.  NECK:  Supple, no jugular venous distention. No thyroid enlargement, no tenderness.  LUNGS: Normal breath sounds bilaterally, no wheezing, rales,rhonchi or crepitation. No use of accessory muscles of respiration.  CARDIOVASCULAR: S1, S2 normal. No murmurs, rubs, or gallops.  ABDOMEN: Soft, nontender, nondistended. Bowel sounds present. No organomegaly or mass.  EXTREMITIES: No pedal edema, cyanosis, or clubbing.  NEUROLOGIC: Cranial nerves II through XII are intact. Muscle strength 5/5 in all extremities. Sensation intact. Gait not checked.  PSYCHIATRIC: The patient is alert and oriented x 3.  SKIN: No obvious rash, lesion, or ulcer.   Data Review     CBC w Diff:  Lab Results  Component Value Date   WBC  16.7 (H) 10/29/2017   HGB 11.4 (L) 10/29/2017   HCT 34.3 (L) 10/29/2017   PLT 295 10/29/2017   CMP:  Lab Results  Component Value Date   NA 137 10/29/2017   K 4.3 10/29/2017   CL 101 10/29/2017   CO2 28 10/29/2017   BUN 9 10/29/2017   CREATININE 0.56 10/29/2017  .  Micro Results Recent Results (from the past 240 hour(s))  Blood Culture (routine x 2)     Status: None (Preliminary result)   Collection Time: 10/28/17  9:28 PM  Result Value Ref Range Status   Specimen Description BLOOD LEFT ANTECUBITAL  Final   Special Requests   Final    BOTTLES DRAWN AEROBIC AND ANAEROBIC Blood Culture adequate volume   Culture   Final    NO GROWTH 2 DAYS Performed at [facility], [address] [place], [place] [address]    Report Status PENDING  Incomplete  Blood Culture (routine x 2)     Status: None (Preliminary result)   Collection Time: 10/28/17  9:28 PM  Result Value Ref Range Status   Specimen Description BLOOD RIGHT FOREARM  Final   Special Requests   Final    BOTTLES DRAWN AEROBIC AND ANAEROBIC Blood Culture adequate volume   Culture   Final    NO GROWTH 2 DAYS Performed at [facility], [address], [place], [place] [address]    Report Status PENDING  Incomplete  MRSA PCR Screening     Status: None   Collection Time: 10/29/17 12:31 AM  Result Value Ref Range Status   MRSA by PCR NEGATIVE NEGATIVE Final    Comment:        The GeneXpert MRSA Assay (FDA approved for NASAL specimens only), is one component of a comprehensive MRSA colonization surveillance program. It is not intended to diagnose MRSA infection nor to guide or monitor treatment for MRSA infections. Performed at [facility], [address] [place], [place] [address]         Code Status Orders  (From admission, onward)        Start     Ordered   10/29/17 0022  Full code  Continuous     10/29/17 0021    Code Status History    Date Active Date Inactive Code  Status Order ID Comments User Context   06/17/2016 2107 06/20/2016 0856 Full Code [id number]  Conard Novak, MD Inpatient   04/03/2011 1246 04/04/2011 1028 Full Code [id number]  Hilarie Fredrickson, RN Inpatient          Follow-up Information    Merwyn Katos, MD Follow up in 10 day(s).   Specialty:  Pulmonary Disease Why:  new patient sob possible copd , ashtma Contact information: [address] [place] [address] [phone]           Discharge Medications   Allergies as of 10/30/2017      Reactions   Nickel Rash      Medication List    TAKE these medications   doxycycline 100 MG tablet Commonly known as:  VIBRA-TABS Take 1 tablet (100  mg total) by mouth every 12 (twelve) hours for 5 days.   fluticasone 50 MCG/ACT nasal spray Commonly known as:  FLONASE Place 2 sprays into both nostrils daily. Start taking on:  10/31/2017   guaiFENesin-codeine 100-10 MG/5ML syrup Take 10 mLs by mouth every 4 (four) hours as needed for up to 5 days for cough.   Ipratropium-Albuterol 20-100 MCG/ACT Aers respimat Commonly known as:  COMBIVENT RESPIMAT Inhale 1 puff into the lungs every 6 (six) hours.   ipratropium-albuterol 0.5-2.5 (3) MG/3ML Soln Commonly known as:  DUONEB Take 3 mLs by nebulization every 6 (six) hours.   mometasone-formoterol 200-5 MCG/ACT Aero Commonly known as:  DULERA Inhale 2 puffs into the lungs 2 (two) times daily.   predniSONE 10 MG (21) Tbpk tablet Commonly known as:  STERAPRED UNI-PAK 21 TAB Start 60mg  taper by 10mg  until complete          Total Time in preparing paper work, data evaluation and todays exam - 35 minutes  Auburn BilberryShreyang Arpita Fentress M.D on 10/30/2017 at 1:27 PM [facility]   Office  [phone]

## 2017-10-30 NOTE — Care Management Note (Addendum)
Case Management Note  Patient Details  Name: Kendra Lowe MRN: [id number] Date of Birth: 11/02/1990  Subjective/Objective:                 Alain Honey Admitted to [facility] with the diagnosis of asthma exacerbation.  Lives with family. Mother is Fredirick MaudlinSheilia Bright [phone]). No primary care physician listed. No insurance.  States she has never been to the [facility] or used [facility].  [place] Telephone# [phone]  Action/Plan: Application for [facility] placed in room.  Faxed prescriptions to [facility] Message sent to [facility]izer from [facility]  Expected Discharge Date:                  Expected Discharge Plan:     In-House Referral:     Discharge planning Services     Post Acute Care Choice:    Choice offered to:     DME Arranged:   yes DME Agency:   [facility]  HH Arranged:    HH Agency:     Status of Service:     If discussed at Long Length of Stay Meetings, dates discussed:    Additional Comments:  Gwenette GreetBrenda S Jeani Fassnacht, RN MSN CCM Care Management [phone] 10/30/2017, 11:54 AM

## 2017-10-30 NOTE — Telephone Encounter (Signed)
Noted  

## 2017-10-30 NOTE — Telephone Encounter (Signed)
-----   Message from Renea EeMisty R Ahmad, [place] sent at 7/82/95627/15/2019  8:08 AM EDT ----- Please schedule with Sung AmabileSimonds next available in a 30 minute slot for bronchitis.  Thanks, Misty   ----- Message ----- From: Merwyn KatosSimonds, David B, MD Sent: 10/29/2017  11:21 AM To: Renea EeMisty R Ahmad, LPN  Please schedule office follow up with me next available  Thanks  Theodoro GristDave

## 2017-10-30 NOTE — Progress Notes (Signed)
Inpatient Diabetes Program Recommendations  AACE/ADA: New Consensus Statement on Inpatient Glycemic Control (2019)  Target Ranges:  Prepandial:   less than 140 mg/dL      Peak postprandial:   less than 180 mg/dL (1-2 hours)      Critically ill patients:  140 - 180 mg/dL   Results for Kendra Lowe, [name] (MRN [id number]) as of 10/30/2017 08:37  Ref. Range 10/29/2017 08:12 10/29/2017 11:41 10/29/2017 16:47 10/29/2017 19:56 10/30/2017 08:04  Glucose-Capillary Latest Ref Range: 70 - 99 mg/dL 914287 (H) 782300 (H) 956190 (H) 209 (H) 172 (H)   Review of Glycemic Control  Diabetes history: History of GDM  Outpatient Diabetes medications: None Current orders for Inpatient glycemic control: Novolog 0-9 units TID with meals, Novolog 0-5 units QHS; Solumedrol 40 mg Q12H  Inpatient Diabetes Program Recommendations: Insulin - Meal Coverage: If steroids are continued, please consider ordering Novolog 3 units TID with meals for meal coverage if patient eats at least 50% of meals.  NOTE: Admitted with asthma exacerbation and ordered steroids which are contributing to hyperglycemia.   Thanks, Orlando PennerMarie Lucero Ide, RN, MSN, CDE Diabetes Coordinator Inpatient Diabetes Program [phone] (Team Pager from 8am to 5pm)

## 2017-10-30 NOTE — Telephone Encounter (Signed)
Scheduled 7/16 at 830

## 2017-10-30 NOTE — Progress Notes (Signed)
Pt being discharged home, discharge instructions and prescriptions reviewed with pt, states understanding, pt with no complaints at discharge 

## 2017-10-31 ENCOUNTER — Ambulatory Visit: Payer: Medicaid Other | Admitting: Pulmonary Disease

## 2017-10-31 LAB — HIV ANTIBODY (ROUTINE TESTING W REFLEX): HIV Screen 4th Generation wRfx: NONREACTIVE

## 2017-11-02 LAB — CULTURE, BLOOD (ROUTINE X 2)
CULTURE: NO GROWTH
CULTURE: NO GROWTH
Special Requests: ADEQUATE
Special Requests: ADEQUATE

## 2017-11-06 LAB — BLOOD GAS, VENOUS
ACID-BASE EXCESS: 5.4 mmol/L — AB (ref 0.0–2.0)
Bicarbonate: 32.9 mmol/L — ABNORMAL HIGH (ref 20.0–28.0)
FIO2: 100
O2 Saturation: UNDETERMINED %
PCO2 VEN: 61 mmHg — AB (ref 44.0–60.0)
PH VEN: 7.34 (ref 7.250–7.430)
Patient temperature: 37
pO2, Ven: <31 mmHg — CL (ref 32.0–45.0)

## 2017-11-13 ENCOUNTER — Encounter: Payer: Self-pay | Admitting: Pulmonary Disease

## 2017-11-13 ENCOUNTER — Ambulatory Visit (INDEPENDENT_AMBULATORY_CARE_PROVIDER_SITE_OTHER): Payer: Self-pay | Admitting: Pulmonary Disease

## 2017-11-13 VITALS — BP 110/68 | HR 112 | Ht 66.0 in | Wt 132.0 lb

## 2017-11-13 DIAGNOSIS — F17200 Nicotine dependence, unspecified, uncomplicated: Secondary | ICD-10-CM

## 2017-11-13 DIAGNOSIS — J4541 Moderate persistent asthma with (acute) exacerbation: Secondary | ICD-10-CM

## 2017-11-13 MED ORDER — FLUTICASONE PROPIONATE 50 MCG/ACT NA SUSP: 2.0000 | Freq: Every day | NASAL | 2 refills | Status: AC | PRN

## 2017-11-13 MED ORDER — PREDNISONE 20 MG PO TABS: 40.0000 mg | ORAL_TABLET | Freq: Every day | ORAL | 0 refills | Status: AC

## 2017-11-13 MED ORDER — NICOTINE POLACRILEX 2 MG MT GUM: 2.0000 mg | CHEWING_GUM | OROMUCOSAL | 10 refills | Status: AC | PRN

## 2017-11-13 MED ORDER — IPRATROPIUM-ALBUTEROL 0.5-2.5 (3) MG/3ML IN SOLN: 3.0000 mL | Freq: Four times a day (QID) | RESPIRATORY_TRACT | 30 refills | Status: AC | PRN

## 2017-11-13 MED ORDER — ALBUTEROL SULFATE HFA 108 (90 BASE) MCG/ACT IN AERS: 1.0000 | INHALATION_SPRAY | RESPIRATORY_TRACT | 10 refills | Status: AC | PRN

## 2017-11-13 MED ORDER — BUDESONIDE-FORMOTEROL FUMARATE 160-4.5 MCG/ACT IN AERO: 2.0000 | INHALATION_SPRAY | Freq: Two times a day (BID) | RESPIRATORY_TRACT | 12 refills | Status: AC

## 2017-11-13 NOTE — Patient Instructions (Addendum)
We discussed at length the importance of controlling asthma rather than reacting to it after it happens.  It is essential to control the inflammation in airways with regular medication.  We also discussed the importance of smoking cessation.  I emphasized that we will never achieve optimal asthma control in the face of ongoing smoking  I have placed a prescription for nicotine gum, 2 mg.  You may chew up to 5 or 6 pieces/day as needed for symptoms of nicotine withdrawal  Prednisone 40 mg (2 tablets) daily for 5 days  Symbicort 160-4.5, 2 actuations twice a day.  Rinse mouth after use.  This is your controller medication  Albuterol inhaler, 1-2 actuations up to every 4 hours as needed for increased wheezing, cough, shortness of breath, chest tightness.  This is your first-line rescue medication  Nebulized DuoNeb as needed.  This is your second line rescue medication  If your symptoms are not relieved after using a nebulizer treatment, you must seek medical attention in our office or an urgent care center or emergency department  If you are using the nebulizer more than twice a day or more than 3 days in a row, please contact our office  Follow-up in 3 to 4 weeks
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
## 2017-12-05 ENCOUNTER — Ambulatory Visit: Payer: Medicaid Other | Admitting: Pulmonary Disease

## 2018-01-03 ENCOUNTER — Telehealth: Payer: Self-pay | Admitting: Pulmonary Disease

## 2018-01-03 NOTE — Telephone Encounter (Signed)
Patient calling stating had to miss work on Monday due to asthma.  She is calling stating last time she was here she was given samples and is asking for samples again  She is willing to pick up that as well as a letter for her being out of work on Monday. She states she had a asthma attack and couldn't go to work  She is scheduled to see Dr Sung AmabileSimonds in the morning  Please advise

## 2018-01-03 NOTE — Telephone Encounter (Signed)
Left message that DS not in clinic to authorize work note. Pt has appt tomorrow.

## 2018-01-04 ENCOUNTER — Ambulatory Visit (INDEPENDENT_AMBULATORY_CARE_PROVIDER_SITE_OTHER): Payer: Self-pay | Admitting: Pulmonary Disease

## 2018-01-04 ENCOUNTER — Encounter: Payer: Self-pay | Admitting: Pulmonary Disease

## 2018-01-04 VITALS — BP 132/78 | HR 78 | Ht 61.0 in | Wt 136.8 lb

## 2018-01-04 DIAGNOSIS — J4541 Moderate persistent asthma with (acute) exacerbation: Secondary | ICD-10-CM

## 2018-01-04 DIAGNOSIS — F17200 Nicotine dependence, unspecified, uncomplicated: Secondary | ICD-10-CM

## 2018-01-04 DIAGNOSIS — Z595 Extreme poverty: Secondary | ICD-10-CM

## 2018-01-04 MED ORDER — BUDESONIDE-FORMOTEROL FUMARATE 160-4.5 MCG/ACT IN AERO: 2.0000 | INHALATION_SPRAY | Freq: Every day | RESPIRATORY_TRACT | 0 refills | Status: AC

## 2018-01-04 NOTE — Patient Instructions (Signed)
You must quit smoking altogether to achieve maximum control of asthma  Continue (resume) Symbicort inhaler, 2 sprays twice a day.  Rinse mouth after use.  Samples provided  Continue albuterol inhaler as needed for increased shortness of breath, wheezing, chest tightness, cough  Continue nebulizer as needed if albuterol inhaler fails to relieve symptoms  If your symptoms do not improve after using the nebulizer, you need to be evaluated in an urgent care center or emergency department  Your employer should excuse her absence from work on Monday 9/16 for asthma exacerbation and today 9/19 for physician's appointment  Follow-up in 2 to 3 months.  Call sooner if needed
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
## 2018-03-13 ENCOUNTER — Ambulatory Visit: Payer: Self-pay | Admitting: Pulmonary Disease

## 2018-03-14 ENCOUNTER — Encounter: Payer: Self-pay | Admitting: Pulmonary Disease

## 2019-04-03 ENCOUNTER — Ambulatory Visit: Payer: Self-pay

## 2019-05-13 ENCOUNTER — Ambulatory Visit (HOSPITAL_COMMUNITY)
Admission: AD | Admit: 2019-05-13 | Discharge: 2019-05-13 | Disposition: A | Discharge status: Home / Self Care | Payer: Medicaid Other | Attending: Psychiatry | Admitting: Psychiatry

## 2019-05-13 DIAGNOSIS — F330 Major depressive disorder, recurrent, mild: Secondary | ICD-10-CM | POA: Diagnosis not present

## 2019-05-13 DIAGNOSIS — Z1389 Encounter for screening for other disorder: Secondary | ICD-10-CM | POA: Diagnosis present

## 2019-05-13 DIAGNOSIS — F329 Major depressive disorder, single episode, unspecified: Secondary | ICD-10-CM | POA: Diagnosis not present

## 2019-05-13 DIAGNOSIS — F1110 Opioid abuse, uncomplicated: Secondary | ICD-10-CM
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
## 2019-11-17 DEATH — deceased
